# Patient Record
Sex: Male | Born: 1970 | Race: Black or African American | Hispanic: No | Marital: Single | Smoking: Current some day smoker
Health system: Southern US, Community
[De-identification: ages and names within clinical notes are randomized; demographics above are authoritative.]

## PROBLEM LIST (undated history)

## (undated) DIAGNOSIS — E119 Type 2 diabetes mellitus without complications: Secondary | ICD-10-CM

## (undated) DIAGNOSIS — Z789 Other specified health status: Secondary | ICD-10-CM

## (undated) HISTORY — PX: NO PAST SURGERIES: SHX2092

---

## 2006-07-05 ENCOUNTER — Emergency Department (HOSPITAL_COMMUNITY): Admission: EM | Admit: 2006-07-05 | Discharge: 2006-07-05 | Payer: Self-pay | Admitting: Emergency Medicine

## 2008-05-14 ENCOUNTER — Emergency Department (HOSPITAL_COMMUNITY): Admission: EM | Admit: 2008-05-14 | Discharge: 2008-05-14 | Payer: Self-pay | Admitting: Family Medicine

## 2008-10-29 ENCOUNTER — Emergency Department (HOSPITAL_COMMUNITY): Admission: EM | Admit: 2008-10-29 | Discharge: 2008-10-29 | Payer: Self-pay | Admitting: Emergency Medicine

## 2008-11-02 ENCOUNTER — Emergency Department (HOSPITAL_COMMUNITY): Admission: EM | Admit: 2008-11-02 | Discharge: 2008-11-02 | Payer: Self-pay | Admitting: Emergency Medicine

## 2012-01-02 ENCOUNTER — Encounter (HOSPITAL_COMMUNITY): Payer: Self-pay | Admitting: *Deleted

## 2012-01-02 ENCOUNTER — Emergency Department (HOSPITAL_COMMUNITY)
Admission: EM | Admit: 2012-01-02 | Discharge: 2012-01-02 | Disposition: A | Discharge status: Home / Self Care | Payer: Self-pay | Attending: Emergency Medicine | Admitting: Emergency Medicine

## 2012-01-02 DIAGNOSIS — L02419 Cutaneous abscess of limb, unspecified: Secondary | ICD-10-CM | POA: Insufficient documentation

## 2012-01-02 DIAGNOSIS — F17200 Nicotine dependence, unspecified, uncomplicated: Secondary | ICD-10-CM | POA: Insufficient documentation

## 2012-01-02 DIAGNOSIS — L02415 Cutaneous abscess of right lower limb: Secondary | ICD-10-CM

## 2012-01-02 MED ORDER — SULFAMETHOXAZOLE-TRIMETHOPRIM 800-160 MG PO TABS: 1.0000 | ORAL_TABLET | Freq: Two times a day (BID) | ORAL | Status: AC

## 2012-01-02 MED ORDER — LIDOCAINE-EPINEPHRINE 2 %-1:100000 IJ SOLN
20.0000 mL | Freq: Once | INTRAMUSCULAR | Status: AC
  Administered 2012-01-02: 20 mL

## 2012-01-02 MED ORDER — CEPHALEXIN 500 MG PO CAPS: 500.0000 mg | ORAL_CAPSULE | Freq: Four times a day (QID) | ORAL | Status: AC

## 2012-01-02 NOTE — ED Notes (Signed)
Pt has multiple small abscesses to bilateral lower extremities

## 2012-01-02 NOTE — ED Notes (Signed)
Reports he has been "picking " at the abscesses

## 2012-01-02 NOTE — ED Provider Notes (Signed)
History  This chart was scribed for Glynn Octave, MD by Shari Heritage. The patient was seen in room TR09C/TR09C. Patient's care was started at 0915.     CSN: [id number]  Arrival date & time 01/02/12  0915   First MD Initiated Contact with Patient 01/02/12 [phone]      Chief Complaint  Patient presents with  . Abscess    bilateral legs    The history is provided by the patient. No language interpreter was used.   Rodney Foley is a 41 y.o. male who presents to the Emergency Department complaining of multiple small boils to his bilateral upper extremities onset 4-5 days ago. There is associated stinging pain at the boil sites. Patient says that he has seen pus drain from at least one of these areas. Patient thinks he has had a mild fever over the past few days, but he hasn't taken his temperature. He states that he hasn't been camping recently. Patient denies any significant medical, surgical or family history. He is a current everyday smoker.   History  Substance Use Topics  . Smoking status: Current Everyday Smoker  . Smokeless tobacco: Not on file  . Alcohol Use: No     occ      Review of Systems A complete 10 system review of systems was obtained and all systems are negative except as noted in the HPI and PMH.   Allergies  Review of patient's allergies indicates no known allergies.  Home Medications  No current outpatient prescriptions on file.  BP 130/78  Pulse 72  Temp 98.5 F (36.9 C) (Oral)  Resp 20  SpO2 99%  Physical Exam  Constitutional: He is oriented to person, place, and time. He appears well-developed and well-nourished.  HENT:  Head: Normocephalic and atraumatic.  Musculoskeletal: Normal range of motion.  Neurological: He is alert and oriented to person, place, and time.  Skin: Skin is warm.       2 cm area of induration to the left anterior thigh. Scattered small areas of induration to right thigh and left medial thigh. 1 cm area of induration and  fluctuance to right anterior thigh.   Psychiatric: He has a normal mood and affect. His behavior is normal.    ED Course  Procedures (including critical care time) DIAGNOSTIC STUDIES: Oxygen Saturation is 99% on room air, normal by my interpretation.    COORDINATION OF CARE: 9:43am- Patient informed of current plan for treatment and evaluation and agrees with plan at this time.  10:15am- Performed two I & D procedures to abscesses on left anterior thigh and right anterior thigh.  INCISION AND DRAINAGE PROCEDURE NOTE: Patient identification was confirmed and verbal consent was obtained. This procedure was performed by Glynn Octave, MD at 10:15 AM. Site: Left anterior thigh Sterile procedures observed: yes Needle size: 25 Anesthetic used (type and amt): 1% lidocaine with epinephrine Blade size: 11 Drainage: copious purulent Complexity: Complex Packing used: 1/4 iodoform Site anesthetized, incision made over site, wound drained and explored loculations, rinsed with copious amounts of normal saline, wound packed with sterile gauze, covered with dry, sterile dressing.  Pt tolerated procedure well without complications.  Instructions for care discussed verbally and pt provided with additional written instructions for homecare and f/u.  INCISION AND DRAINAGE PROCEDURE NOTE: Patient identification was confirmed and verbal consent was obtained. This procedure was performed by Glynn Octave, MD at 10:15 AM. Site: right anterior thigh Sterile procedures observed: yes Needle size: 25 Anesthetic used (type and amt):  1% lidocaine with epinephrine Blade size: 11 Drainage: copious purulent Complexity: Complex Packing used: 1/4 iodoform Site anesthetized, incision made over site, wound drained and explored loculations, rinsed with copious amounts of normal saline, wound packed with sterile gauze, covered with dry, sterile dressing.  Pt tolerated procedure well without complications.   Instructions for care discussed verbally and pt provided with additional written instructions for homecare and f/u.    No diagnosis found.    MDM  Multiple abscess with probable underlying insect bites.  No systemic symptoms.  Drainage of two large abscesses as above.  Abx, warm soaks, wound check in 2 days.    I personally performed the services described in this documentation, which was scribed in my presence.  The recorded information has been reviewed and considered.    Glynn Octave, MD 01/02/12 [phone]

## 2012-01-05 ENCOUNTER — Emergency Department (HOSPITAL_COMMUNITY)
Admission: EM | Admit: 2012-01-05 | Discharge: 2012-01-05 | Disposition: A | Discharge status: Home / Self Care | Payer: Self-pay | Attending: Emergency Medicine | Admitting: Emergency Medicine

## 2012-01-05 ENCOUNTER — Encounter (HOSPITAL_COMMUNITY): Payer: Self-pay | Admitting: Emergency Medicine

## 2012-01-05 DIAGNOSIS — F17200 Nicotine dependence, unspecified, uncomplicated: Secondary | ICD-10-CM | POA: Insufficient documentation

## 2012-01-05 DIAGNOSIS — Z5189 Encounter for other specified aftercare: Secondary | ICD-10-CM | POA: Insufficient documentation

## 2012-01-05 NOTE — ED Notes (Signed)
Discharged home with written and verbal instructions.  No questions or concerns at discharge. 
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
## 2012-01-05 NOTE — ED Provider Notes (Signed)
History     CSN: [id number]  Arrival date & time 01/05/12  7829   First MD Initiated Contact with Patient 01/05/12 1030      Chief Complaint  Patient presents with  . Wound Check    (Consider location/radiation/quality/duration/timing/severity/associated sxs/prior treatment) HPI Comments: Patient presents for a wound check of bilateral upper thigh abscesses. The abscesses were drained 3 days ago and he was prescribed Keflex. He has taken his antibiotics and reports subjective improvement of the affected areas. He denies any fever, NVD, abdominal pain.   Patient is a 41 y.o. male presenting with wound check.  Wound Check     History reviewed. No pertinent past medical history.  History reviewed. No pertinent past surgical history.  History reviewed. No pertinent family history.  History  Substance Use Topics  . Smoking status: Current Everyday Smoker  . Smokeless tobacco: Not on file  . Alcohol Use: No     occ      Review of Systems  Constitutional: Negative for fever, diaphoresis and fatigue.  HENT: Negative for facial swelling, neck pain and neck stiffness.   Eyes: Negative for visual disturbance.  Respiratory: Negative for cough, shortness of breath and wheezing.   Cardiovascular: Negative for chest pain.  Gastrointestinal: Negative for nausea, vomiting, abdominal pain and diarrhea.  Musculoskeletal: Negative for back pain and arthralgias.  Skin: Positive for wound.  Neurological: Negative for weakness, numbness and headaches.    Allergies  Review of patient's allergies indicates no known allergies.  Home Medications   Current Outpatient Rx  Name Route Sig Dispense Refill  . CEPHALEXIN 500 MG PO CAPS Oral Take 1 capsule (500 mg total) by mouth 4 (four) times daily. 40 capsule 0  . SULFAMETHOXAZOLE-TRIMETHOPRIM 800-160 MG PO TABS Oral Take 1 tablet by mouth 2 (two) times daily. 28 tablet 0    BP 131/77  Pulse 63  Temp 98.2 F (36.8 C) (Oral)  Resp 18   SpO2 100%  Physical Exam  Nursing note and vitals reviewed. Constitutional: He is oriented to person, place, and time. He appears well-developed and well-nourished. No distress.  HENT:  Head: Normocephalic and atraumatic.  Eyes: Conjunctivae are normal. No scleral icterus.  Neck: Normal range of motion.  Cardiovascular: Normal rate and regular rhythm.  Exam reveals no gallop and no friction rub.   No murmur heard. Pulmonary/Chest: Effort normal and breath sounds normal. No respiratory distress. He has no wheezes. He has no rales. He exhibits no tenderness.  Abdominal: Soft. There is no tenderness.  Musculoskeletal: Normal range of motion.  Neurological: He is alert and oriented to person, place, and time.  Skin: Skin is warm and dry. He is not diaphoretic.       Bilateral upper thigh wounds reveal incision sites from previous I & D. The incisions are healing and reveal a small amount of purulence at the opening. No surrounding erythema or tenderness to palpation.   Psychiatric: He has a normal mood and affect. His behavior is normal.    ED Course  Procedures (including critical care time)  Labs Reviewed - No data to display No results found.   1. Wound check, abscess       MDM  Patient's wound shows improvement. I expressed a small amount of purulent drainage from each site and rebandaged the areas after putting Bacitracin on the wound. I instructed him to finish his antibiotics as directed and return to the ED with any worsening or concerning symptoms.  Emilia Beck, PA-C 01/05/12 1417

## 2012-01-05 NOTE — ED Notes (Signed)
Pt here for wound check from ID on leg 2 days; pt sts packing fell out this am

## 2012-01-07 NOTE — ED Provider Notes (Signed)
Medical screening examination/treatment/procedure(s) were performed by non-physician practitioner and as supervising physician I was immediately available for consultation/collaboration.   Gavin Pound. Oletta Lamas, MD 01/07/12 [phone]

## 2013-01-08 ENCOUNTER — Emergency Department (HOSPITAL_COMMUNITY): Payer: Self-pay

## 2013-01-08 ENCOUNTER — Encounter (HOSPITAL_COMMUNITY): Payer: Self-pay | Admitting: Radiology

## 2013-01-08 ENCOUNTER — Emergency Department (HOSPITAL_COMMUNITY)
Admission: EM | Admit: 2013-01-08 | Discharge: 2013-01-08 | Disposition: A | Discharge status: Home / Self Care | Payer: Self-pay | Attending: Emergency Medicine | Admitting: Emergency Medicine

## 2013-01-08 DIAGNOSIS — S41009A Unspecified open wound of unspecified shoulder, initial encounter: Secondary | ICD-10-CM | POA: Insufficient documentation

## 2013-01-08 DIAGNOSIS — Y929 Unspecified place or not applicable: Secondary | ICD-10-CM | POA: Insufficient documentation

## 2013-01-08 DIAGNOSIS — S0100XA Unspecified open wound of scalp, initial encounter: Secondary | ICD-10-CM | POA: Insufficient documentation

## 2013-01-08 DIAGNOSIS — S0990XA Unspecified injury of head, initial encounter: Secondary | ICD-10-CM | POA: Insufficient documentation

## 2013-01-08 DIAGNOSIS — IMO0002 Reserved for concepts with insufficient information to code with codable children: Secondary | ICD-10-CM | POA: Insufficient documentation

## 2013-01-08 DIAGNOSIS — S0101XA Laceration without foreign body of scalp, initial encounter: Secondary | ICD-10-CM

## 2013-01-08 DIAGNOSIS — T148XXA Other injury of unspecified body region, initial encounter: Secondary | ICD-10-CM

## 2013-01-08 DIAGNOSIS — F17200 Nicotine dependence, unspecified, uncomplicated: Secondary | ICD-10-CM | POA: Insufficient documentation

## 2013-01-08 DIAGNOSIS — Y939 Activity, unspecified: Secondary | ICD-10-CM | POA: Insufficient documentation

## 2013-01-08 DIAGNOSIS — S51809A Unspecified open wound of unspecified forearm, initial encounter: Secondary | ICD-10-CM | POA: Insufficient documentation

## 2013-01-08 MED ORDER — AMOXICILLIN-POT CLAVULANATE 875-125 MG PO TABS: 1.0000 | ORAL_TABLET | Freq: Two times a day (BID) | ORAL | Status: DC

## 2013-01-08 MED ORDER — AMOXICILLIN-POT CLAVULANATE 875-125 MG PO TABS
1.0000 | ORAL_TABLET | Freq: Once | ORAL | Status: AC
  Administered 2013-01-08: 1 via ORAL
  Filled 2013-01-08: qty 1
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
## 2013-01-18 ENCOUNTER — Emergency Department (INDEPENDENT_AMBULATORY_CARE_PROVIDER_SITE_OTHER)
Admission: EM | Admit: 2013-01-18 | Discharge: 2013-01-18 | Disposition: A | Discharge status: Home / Self Care | Payer: Self-pay | Source: Home / Self Care | Attending: Family Medicine | Admitting: Family Medicine

## 2013-01-18 ENCOUNTER — Encounter (HOSPITAL_COMMUNITY): Payer: Self-pay

## 2013-01-18 DIAGNOSIS — Z4802 Encounter for removal of sutures: Secondary | ICD-10-CM

## 2013-01-18 DIAGNOSIS — Z5189 Encounter for other specified aftercare: Secondary | ICD-10-CM

## 2013-01-18 NOTE — ED Notes (Signed)
Recheck of wounds from 8-26
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
## 2014-01-27 DIAGNOSIS — S01501A Unspecified open wound of lip, initial encounter: Secondary | ICD-10-CM | POA: Insufficient documentation

## 2014-01-27 DIAGNOSIS — F17200 Nicotine dependence, unspecified, uncomplicated: Secondary | ICD-10-CM | POA: Insufficient documentation

## 2014-01-27 DIAGNOSIS — Z792 Long term (current) use of antibiotics: Secondary | ICD-10-CM | POA: Insufficient documentation

## 2014-01-28 ENCOUNTER — Emergency Department (HOSPITAL_COMMUNITY)
Admission: EM | Admit: 2014-01-28 | Discharge: 2014-01-28 | Disposition: A | Discharge status: Home / Self Care | Payer: Self-pay | Attending: Emergency Medicine | Admitting: Emergency Medicine

## 2014-01-28 ENCOUNTER — Encounter (HOSPITAL_COMMUNITY): Payer: Self-pay | Admitting: Emergency Medicine

## 2014-01-28 DIAGNOSIS — S01511A Laceration without foreign body of lip, initial encounter: Secondary | ICD-10-CM

## 2014-01-28 MED ORDER — NAPROXEN 500 MG PO TABS
500.0000 mg | ORAL_TABLET | Freq: Two times a day (BID) | ORAL | Status: DC
Start: 2014-01-28 — End: 2015-02-23

## 2014-01-28 MED ORDER — PENICILLIN V POTASSIUM 500 MG PO TABS: 500.0000 mg | ORAL_TABLET | Freq: Four times a day (QID) | ORAL | Status: DC

## 2014-01-28 MED ORDER — TRAMADOL HCL 50 MG PO TABS: 50.0000 mg | ORAL_TABLET | Freq: Four times a day (QID) | ORAL | Status: DC | PRN

## 2014-01-28 MED ORDER — LIDOCAINE HCL (PF) 1 % IJ SOLN
5.0000 mL | Freq: Once | INTRAMUSCULAR | Status: AC
  Administered 2014-01-28: 5 mL via INTRADERMAL
  Filled 2014-01-28: qty 5

## 2014-01-28 NOTE — ED Notes (Signed)
Declined W/C at D/C and was escorted to lobby by RN. 
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
## 2014-01-28 NOTE — Discharge Instructions (Signed)
Sutures out in 5 days  Laceration:  The laceration is a cut or lesion that goes through all layers of the skin and into the tissue just beneath the skin. This may have been repaired by your caregiver with either stitches or a tissue adhesive similar to a super glue.  Please keep your wound clean and dry with a topical antibiotic and a sterile dressing for the next 48 hours. Your wound should be reevaluated by your family doctor within the next 2 days for a recheck. If you do not have a family doctor you may return to the emergency department for a recheck or see the list of followup doctors below.  Seek medical attention if:   There is redness, swelling, increasing pain in the wound  There is a red line that goes up your arm or leg  Pus is coming from the wound  He developed an unexplained temperature above 100.65F  He noticed a foul-smelling coming from the wound or dressing  There is a breaking open of the wound after the sutures have been removed  If you did not receive a tetanus shot today because she thought she were up to date but did not recall when her last one was given, nature to check with her primary caregiver to determine if she needs one.   RESOURCE GUIDE  Dental Problems  Patients with Medicaid: [facility] [phone] [address]                                           [phone] [address] Phone:  [phone]                                                  Phone:  [phone]  If unable to pay or uninsured, contact:  [facility] or [facility]. to become qualified for the adult dental clinic.  Chronic Pain Problems Contact [facility]  [phone] Patients need to be referred by their primary care doctor.  Insufficient Money for Medicine Contact [employer]:  call "211" or [facility] 2405330674.  No Primary Care Doctor Call [facility]  [phone] Other agencies that  provide inexpensive medical care    [facility]  [phone]    [facility]  [phone]    [facility]  [phone]    [facility]  [phone]    [facility]  [phone]    [facility]  [phone]  Psychological Services [facility]  [phone] [facility]  [phone] [facility]   [phone] (emergency services (319)[phone])  Substance Abuse Resources [facility]  ([phone] [facility] 316-362-6609 [employer] [phone] Floydene Flock 9734204260 Residential & Outpatient Substance Abuse Program  (214) 193-7084  Abuse/Neglect [facility] Child Abuse Hotline 9208164969 [facility] Child Abuse Hotline 972-150-5532 (After Hours)  Emergency Shelter [facility] 8385019641  Maternity Homes Room at the [place] [facility] 515-552-0910 [facility] Services [phone]  MRSA Hotline #:   (218)875-4268    Aaron Edelman  [employer]  [facility]     [employer]                          [facility] [address] [place]                       [address]      [address]  Blondell Reveal Phone:  [phone]                                   Phone:  [phone]                 Phone:  [phone]  [facility] Phone:  [phone]  [facility] Child Abuse Hotline (713)[phone] (After Hours)
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
## 2014-02-04 ENCOUNTER — Encounter (HOSPITAL_COMMUNITY): Payer: Self-pay | Admitting: Emergency Medicine

## 2014-02-04 ENCOUNTER — Emergency Department (HOSPITAL_COMMUNITY)
Admission: EM | Admit: 2014-02-04 | Discharge: 2014-02-04 | Disposition: A | Discharge status: Home / Self Care | Payer: Self-pay | Attending: Emergency Medicine | Admitting: Emergency Medicine

## 2014-02-04 DIAGNOSIS — Z4802 Encounter for removal of sutures: Secondary | ICD-10-CM | POA: Insufficient documentation

## 2014-02-04 DIAGNOSIS — Z79899 Other long term (current) drug therapy: Secondary | ICD-10-CM | POA: Insufficient documentation

## 2014-02-04 DIAGNOSIS — Z792 Long term (current) use of antibiotics: Secondary | ICD-10-CM | POA: Insufficient documentation

## 2014-02-04 DIAGNOSIS — F17200 Nicotine dependence, unspecified, uncomplicated: Secondary | ICD-10-CM | POA: Insufficient documentation

## 2014-02-04 DIAGNOSIS — Z791 Long term (current) use of non-steroidal anti-inflammatories (NSAID): Secondary | ICD-10-CM | POA: Insufficient documentation

## 2014-02-04 NOTE — ED Provider Notes (Signed)
CSN: [id number]     Arrival date & time 02/04/14  1825 History  This chart was scribed for non-physician practitioner, Marlon Pel, PA-C working with Geoffery Lyons, MD by Luisa Dago, ED scribe. This patient was seen in room TR10C/TR10C and the patient's care was started at 7:16 PM.   Chief Complaint  Patient presents with  . Suture / Staple Removal    The history is provided by the patient. No language interpreter was used.   HPI Comments: Rodney Foley is a 43 y.o. male who presents to the Emergency Department requesting a suture removal. Was seen in the ED on 9/15 with a laceration to the lower lip, 5 sutures were placed. He states that the laceration is healing well. Denies any redness, increased pain, swelling, fever, chills, nausea, emesis, abdominal pain, headaches, SOB, or chest pain.   No past medical history on file. No past surgical history on file. No family history on file. History  Substance Use Topics  . Smoking status: Current Every Day Smoker  . Smokeless tobacco: Not on file  . Alcohol Use: No     Comment: occ    Review of Systems  Constitutional: Negative for fever and chills.  HENT: Negative for congestion.   Eyes: Negative for visual disturbance.  Respiratory: Negative for cough.   Gastrointestinal: Negative for nausea and vomiting.  Genitourinary: Negative for dysuria.  Skin: Positive for wound.   Allergies  Review of patient's allergies indicates no known allergies.  Home Medications   Prior to Admission medications   Medication Sig Start Date End Date Taking? Authorizing Provider  amoxicillin-clavulanate (AUGMENTIN) 875-125 MG per tablet Take 1 tablet by mouth 2 (two) times daily. One po bid x 10 days 01/08/13   April K Palumbo-Rasch, MD  naproxen (NAPROSYN) 500 MG tablet Take 1 tablet (500 mg total) by mouth 2 (two) times daily with a meal. 01/28/14   Vida Roller, MD  penicillin v potassium (VEETID) 500 MG tablet Take 1 tablet (500 mg total) by  mouth 4 (four) times daily. 01/28/14   Vida Roller, MD  traMADol (ULTRAM) 50 MG tablet Take 1 tablet (50 mg total) by mouth every 6 (six) hours as needed. 01/28/14   Vida Roller, MD   Triage vitals:BP 127/73  Pulse 68  Temp(Src) 97 F (36.1 C) (Oral)  Resp 12  SpO2 97%  Physical Exam  Nursing note and vitals reviewed. Constitutional: He is oriented to person, place, and time. He appears well-developed and well-nourished. No distress.  HENT:  Head: Normocephalic and atraumatic.  Right Ear: External ear normal.  Left Ear: External ear normal.  Nose: Nose normal.  Mouth/Throat: Oropharynx is clear and moist.  5 intact sutures to lower lip with no associated cellulitis. Wound well appearing.   Eyes: Conjunctivae and EOM are normal. Pupils are equal, round, and reactive to light.  Neck: Normal range of motion. Neck supple.  Cardiovascular: Normal rate.   Pulmonary/Chest: Effort normal. No respiratory distress.  Musculoskeletal: Normal range of motion.  Neurological: He is alert and oriented to person, place, and time.  Skin: Skin is warm and dry.  Psychiatric: He has a normal mood and affect. His behavior is normal.    ED Course  Procedures (including critical care time)  DIAGNOSTIC STUDIES: Oxygen Saturation is 97% on RA, normal by my interpretation.    COORDINATION OF CARE: 7:19 PM- Patient presents for suture removal. The wound is well healed without signs of infection.  The sutures are  removed. Wound care and activity instructions given. Return prn. Pt advised of plan for treatment and pt agrees.  SUTURE REMOVAL Performed by: Marlon Pel, PA-C Consent: Verbal consent obtained. Patient identity confirmed: provided demographic data Location: lower lip Wound Appearance: clean Sutures/Staples Removed: 5 Patient tolerance: Patient tolerated the procedure well with no immediate complications.   Labs Review Labs Reviewed - No data to display  Imaging Review No  results found.   EKG Interpretation None      MDM   Final diagnoses:  Visit for suture removal    43 y.o.[name]'s evaluation in the Emergency Department is complete. It has been determined that no acute conditions requiring further emergency intervention are present at this time. The patient/guardian have been advised of the diagnosis and plan. We have discussed signs and symptoms that warrant return to the ED, such as changes or worsening in symptoms.  Vital signs are stable at discharge. Filed Vitals:   02/04/14 1846  BP: 127/73  Pulse: 68  Temp: 97 F (36.1 C)  Resp: 12    Patient/guardian has voiced understanding and agreed to follow-up with the PCP or specialist.   I personally performed the services described in this documentation, which was scribed in my presence. The recorded information has been reviewed and is accurate.    Dorthula Matas, PA-C 02/04/14 1946

## 2014-02-04 NOTE — ED Notes (Signed)
Suture removal from lower lip. No drainage, redness, warmth to site.

## 2014-02-04 NOTE — Discharge Instructions (Signed)

## 2014-02-06 NOTE — ED Provider Notes (Signed)
Medical screening examination/treatment/procedure(s) were performed by non-physician practitioner and as supervising physician I was immediately available for consultation/collaboration.     Geoffery Lyons, MD 02/06/14 [phone]

## 2015-02-23 ENCOUNTER — Emergency Department (HOSPITAL_COMMUNITY)
Admission: EM | Admit: 2015-02-23 | Discharge: 2015-02-23 | Disposition: A | Discharge status: Home / Self Care | Payer: Self-pay | Attending: Emergency Medicine | Admitting: Emergency Medicine

## 2015-02-23 ENCOUNTER — Encounter (HOSPITAL_COMMUNITY): Payer: Self-pay | Admitting: *Deleted

## 2015-02-23 ENCOUNTER — Emergency Department (HOSPITAL_COMMUNITY): Payer: Self-pay

## 2015-02-23 DIAGNOSIS — Z792 Long term (current) use of antibiotics: Secondary | ICD-10-CM | POA: Insufficient documentation

## 2015-02-23 DIAGNOSIS — Z720 Tobacco use: Secondary | ICD-10-CM | POA: Insufficient documentation

## 2015-02-23 DIAGNOSIS — M25561 Pain in right knee: Secondary | ICD-10-CM | POA: Insufficient documentation

## 2015-02-23 MED ORDER — TRAMADOL HCL 50 MG PO TABS: 50.0000 mg | ORAL_TABLET | Freq: Four times a day (QID) | ORAL | Status: DC | PRN

## 2015-02-23 MED ORDER — NAPROXEN 500 MG PO TABS: 500.0000 mg | ORAL_TABLET | Freq: Two times a day (BID) | ORAL | Status: DC

## 2015-02-23 NOTE — Discharge Instructions (Signed)
Be sure to read and understand instructions below prior to leaving the hospital. It is recommended that you follow up with the Orthopedist listed above. Use your pain medication as prescribed and do not operate heavy machinery while on pain medication.   Knee Effusion  The medical term for having fluid in your knee is effusion.This means something is wrong inside the knee. Some of the causes of fluid in the knee may be torn cartilage, a torn ligament, or bleeding into the joint from an injury. Small tears may heal on their own with conservative treatment. Conservative means rest, limited weight bearing activity and muscle strengthening exercises. Your recovery may take up to 6 weeks. Larger tears may require surgery.   TREATMENT  Rest, ice, elevation, and compression are the basic modes of treatment.   Apply ice to the sore area for 15 to 20 minutes, 3 to 4 times per day. Do this while you are awake for the first 2 days, or as directed. This can be stopped when the swelling goes away. Put the ice in a plastic bag and place a towel between the bag of ice and your skin.  Keep your leg elevated when possible to lessen swelling.  If your caregiver recommends crutches, use them as instructed for 1 week. Then, you may walk as tolerated.  Do not drive a vehicle on pain medication. ACTIVITY:            - Weight bearing as tolerated            - Exercises should be limited to pain free range of motion  Knee Immobilization:: This is used to support and protect an injured or painful knee. Knee immobilizers keep your knee from being used while it is healing.  Use powder to control irritation from sweat and friction.  Adjust the immobilizer to be firm but not tight. Signs of an immobilizer that is too tight include:   Swelling.   Numbness.   Color change in your foot or ankle.   Increased pain.  While resting, raise your leg above the level of your heart. This reduces throbbing and helps healing. Prop it up  with pillows.  Remove the immobilizer to bathe and sleep. Wear it other times until you see your doctor again.               SEEK MEDICAL CARE IF:  You have an increase in bruising, swelling, or pain.  Your toes feel cold.  Pain relief is not achieved with medications.  EMERGENCY:: Your toes are numb or blue or you have severe pain.  You notice redness, swelling, warmth or increasing pain in your knee.  An unexplained oral temperature above 102 F (38.9 C) develops.  COLD THERAPY DIRECTIONS:  Ice or gel packs can be used to reduce both pain and swelling. Ice is the most helpful within the first 24 to 48 hours after an injury or flareup from overusing a muscle or joint.  Ice is effective, has very few side effects, and is safe for most people to use.   If you expose your skin to cold temperatures for too long or without the proper protection, you can damage your skin or nerves. Watch for signs of skin damage due to cold.   HOME CARE INSTRUCTIONS  Follow these tips to use ice and cold packs safely.  Place a dry or damp towel between the ice and skin. A damp towel will cool the skin more quickly, so you  may need to shorten the time that the ice is used.  For a more rapid response, add gentle compression to the ice.  Ice for no more than 10 to 20 minutes at a time. The bonier the area you are icing, the less time it will take to get the benefits of ice.  Check your skin after 5 minutes to make sure there are no signs of a poor response to cold or skin damage.  Rest 20 minutes or more in between uses.  Once your skin is numb, you can end your treatment. You can test numbness by very lightly touching your skin. The touch should be so light that you do not see the skin dimple from the pressure of your fingertip. When using ice, most people will feel these normal sensations in this order: cold, burning, aching, and numbness.  Do not use ice on someone who cannot communicate their responses to pain,  such as small children or people with dementia.   HOW TO MAKE AN ICE PACK  To make an ice pack, do one of the following:  Place crushed ice or a bag of frozen vegetables in a sealable plastic bag. Squeeze out the excess air. Place this bag inside another plastic bag. Slide the bag into a pillowcase or place a damp towel between your skin and the bag.  Mix 3 parts water with 1 part rubbing alcohol. Freeze the mixture in a sealable plastic bag. When you remove the mixture from the freezer, it will be slushy. Squeeze out the excess air. Place this bag inside another plastic bag. Slide the bag into a pillowcase or place a damp towel between your s

## 2015-02-23 NOTE — ED Notes (Signed)
Pt reports pain to RT knee . Pt does not remember any injury.

## 2015-02-23 NOTE — ED Notes (Signed)
Declined W/C at D/C and was escorted to lobby by RN. 

## 2015-02-23 NOTE — ED Provider Notes (Signed)
CSN: [id number]     Arrival date & time 02/23/15  4098 History  By signing my name below, I, Essence Howell, attest that this documentation has been prepared under the direction and in the presence of [name], PA-C Electronically Signed: Charline Bills, ED Scribe 02/23/2015 at 12:17 PM.   No chief complaint on file.  The history is provided by the patient. No language interpreter was used.   HPI Comments: Rodney Foley is a 44 y.o. male who presents to the Emergency Department complaining of sudden onset of intermittent right knee pain for the past few weeks, worsened over the past 3 days. Pt reports that he has been ambulating with a limp due to severity of pain. Pt denies injury. He also denies fever, chills, swelling, redness, numbness/tingling in right foot. Pt has tried soaking in Epson salt and alcohol baths, Icy Hot and Ace bandages without significant relief.   No past medical history on file. No past surgical history on file. No family history on file. Social History  Substance Use Topics  . Smoking status: Current Every Day Smoker  . Smokeless tobacco: Not on file  . Alcohol Use: No     Comment: occ    Review of Systems  Constitutional: Negative for fever and chills.  Musculoskeletal: Positive for arthralgias. Negative for joint swelling.  Skin: Negative for color change.  Neurological: Negative for numbness.  All other systems reviewed and are negative.  Allergies  Review of patient's allergies indicates no known allergies.  Home Medications   Prior to Admission medications   Medication Sig Start Date End Date Taking? Authorizing Provider  amoxicillin-clavulanate (AUGMENTIN) 875-125 MG per tablet Take 1 tablet by mouth 2 (two) times daily. One po bid x 10 days 01/08/13   [name], MD  naproxen (NAPROSYN) 500 MG tablet Take 1 tablet (500 mg total) by mouth 2 (two) times daily with a meal. 01/28/14   Eber Hong, MD  penicillin v potassium (VEETID) 500 MG  tablet Take 1 tablet (500 mg total) by mouth 4 (four) times daily. 01/28/14   Eber Hong, MD  traMADol (ULTRAM) 50 MG tablet Take 1 tablet (50 mg total) by mouth every 6 (six) hours as needed. 01/28/14   Eber Hong, MD   BP 129/84 mmHg  Pulse 80  Temp(Src) 98.1 F (36.7 C) (Oral)  Resp 14  SpO2 97% Physical Exam  Constitutional: He is oriented to person, place, and time. He appears well-developed and well-nourished. No distress.  HENT:  Head: Normocephalic and atraumatic.  Eyes: Conjunctivae and EOM are normal.  Neck: Neck supple. No tracheal deviation present.  Cardiovascular: Normal rate, regular rhythm and normal heart sounds.   Pulses:      Dorsalis pedis pulses are 2+ on the right side.  Pulmonary/Chest: Effort normal and breath sounds normal. No respiratory distress.  Musculoskeletal: Normal range of motion.  R knee: Tenderness to palpation along the lateral joint line of R knee.  Mild diffuse edema. No warmth or erythema of the R knee. Full ROM of the right knee  Neurological: He is alert and oriented to person, place, and time. No sensory deficit.  Sensation of right foot intact.   Skin: Skin is warm and dry.  Psychiatric: He has a normal mood and affect. His behavior is normal.  Nursing note and vitals reviewed.  ED Course  Procedures (including critical care time) DIAGNOSTIC STUDIES: Oxygen Saturation is 97% on RA, normal by my interpretation.    COORDINATION OF CARE: 9:16  AM-Discussed treatment plan which includes XR with pt at bedside and pt agreed to plan.   Labs Review Labs Reviewed - No data to display  Imaging Review Dg Knee Complete 4 Views Right  02/23/2015   CLINICAL DATA:  Pain along the right anterior knee for 4 days. Patient unable to assume standing position.  EXAM: RIGHT KNEE - COMPLETE 4+ VIEW  COMPARISON:  None.  FINDINGS: Bipartite patella. There appears to be some sclerosis in the patella and spurring along the bipartite portion. There is some  soft tissue prominence along the suprapatellar region with poor definition of fat planes along the quadriceps tendon, but without a well-defined suprapatellar effusion. Questionable stranding in Hoffa's fat pad. Mild prepatellar soft tissue swelling.  IMPRESSION: 1. 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 The sclerosis is a somewhat unusual feature. There is also surrounding soft tissue prominence including subcutaneous edema and low-level edema infiltrating Hoffa's fat pad. If the patient had trauma in this vicinity, and might have disrupted the synchondrosis along the bipartite patella with reactive bony changes and surrounding soft tissue inflammation. If there are signs/ symptoms of infection then the possibility of cellulitis is raised. If further imaging workup is warranted, MRI of the right knee might be considered.   Electronically Signed   By: Gaylyn Rong M.D.   On: 02/23/2015 11:52   I have personally reviewed and evaluated these images and lab results as part of my medical decision-making.   EKG Interpretation None      MDM   Final diagnoses:  None   Patient presents with right knee pain that has been present intermittently over a few weeks, but worse over the past few days.  No acute injury or trauma.  No signs of infection on exam.  Patient afebrile.  Full ROM of the right knee.  Xray results as noted above.  Since there is no evidence of infection or injury.  Therefore, do not feel that the MRI is indicated at this time.  Patient informed of the xray results and given follow up with Orthopedics.  Stable for discharge.  Return precautions given.    [name], PA-C 02/23/15 1605  Pricilla Loveless, MD 02/24/15 [phone]

## 2015-02-23 NOTE — ED Notes (Signed)
PT offered something to eat and drink. Pt declined at this time.

## 2015-03-16 ENCOUNTER — Emergency Department (HOSPITAL_COMMUNITY)
Admission: EM | Admit: 2015-03-16 | Discharge: 2015-03-16 | Disposition: A | Discharge status: Home / Self Care | Payer: Self-pay | Attending: Emergency Medicine | Admitting: Emergency Medicine

## 2015-03-16 ENCOUNTER — Encounter (HOSPITAL_COMMUNITY): Payer: Self-pay | Admitting: Emergency Medicine

## 2015-03-16 DIAGNOSIS — Z720 Tobacco use: Secondary | ICD-10-CM | POA: Insufficient documentation

## 2015-03-16 DIAGNOSIS — Z792 Long term (current) use of antibiotics: Secondary | ICD-10-CM | POA: Insufficient documentation

## 2015-03-16 DIAGNOSIS — H6001 Abscess of right external ear: Secondary | ICD-10-CM | POA: Insufficient documentation

## 2015-03-16 DIAGNOSIS — Z791 Long term (current) use of non-steroidal anti-inflammatories (NSAID): Secondary | ICD-10-CM | POA: Insufficient documentation

## 2015-03-16 MED ORDER — ACETAMINOPHEN 325 MG PO TABS
650.0000 mg | ORAL_TABLET | Freq: Once | ORAL | Status: AC
  Administered 2015-03-16: 650 mg via ORAL
  Filled 2015-03-16: qty 2

## 2015-03-16 NOTE — ED Notes (Signed)
Pt. reports worsening abscess at right ear onset last week with drainage , denies fever .

## 2015-03-16 NOTE — ED Provider Notes (Signed)
CSN: [id number]     Arrival date & time 03/16/15  2137 History  By signing my name below, I, Rodney Foley, attest that this documentation has been prepared under the direction and in the presence of [name], PA-C. Electronically Signed: Angelene GiovanniEmmanuella Foley, ED Scribe. 03/16/2015. 10:01 PM.    Chief Complaint  Patient presents with  . Abscess   The history is provided by the patient. No language interpreter was used.   HPI Comments: Rodney Foley is a 44 y.o. male who presents to the Emergency Department complaining of painful abscess on his right pre-auricular face onset last week. He reports associated drainage of yellow pus that began today. He explains that his symptoms started as a pimple and he scratched it. After scratching it, it turned into a scab and eventually into the abscess. He has not tried anything for pain relief. He denies ear pain, trouble hearing, fever, sore throat, neck pain, redness of the skin around the abscess, warmth of the skin, nausea, vomiting or myalgias.   History reviewed. No pertinent past medical history. History reviewed. No pertinent past surgical history. No family history on file. Social History  Substance Use Topics  . Smoking status: Current Every Day Smoker  . Smokeless tobacco: None  . Alcohol Use: No    Review of Systems  Constitutional: Negative for fever, chills and diaphoresis.  HENT: Negative for ear discharge, ear pain, facial swelling, hearing loss, sore throat and trouble swallowing.   Respiratory: Negative for shortness of breath.   Gastrointestinal: Negative for nausea and vomiting.  Musculoskeletal: Negative for myalgias, neck pain and neck stiffness.  Skin: Positive for wound. Negative for color change.       Abscess on right ear  Neurological: Negative for dizziness and headaches.      Allergies  Review of patient's allergies indicates no known allergies.  Home Medications   Prior to Admission medications    Medication Sig Start Date End Date Taking? Authorizing Provider  amoxicillin-clavulanate (AUGMENTIN) 875-125 MG per tablet Take 1 tablet by mouth 2 (two) times daily. One po bid x 10 days 01/08/13   [name], MD  naproxen (NAPROSYN) 500 MG tablet Take 1 tablet (500 mg total) by mouth 2 (two) times daily. 02/23/15   [name], PA-C  penicillin v potassium (VEETID) 500 MG tablet Take 1 tablet (500 mg total) by mouth 4 (four) times daily. 01/28/14   [name], MD  traMADol (ULTRAM) 50 MG tablet Take 1 tablet (50 mg total) by mouth every 6 (six) hours as needed. 02/23/15   Santiago GladHeather Laisure, PA-C   There were no vitals taken for this visit. Physical Exam  Constitutional: He appears well-developed and well-nourished. No distress.  HENT:  Head: Normocephalic and atraumatic.  Right Ear: Tympanic membrane, external ear and ear canal normal.  Left Ear: Tympanic membrane, external ear and ear canal normal.  Ears:  Mouth/Throat: Oropharynx is clear and moist. No oropharyngeal exudate.  Small, 0.5 cm abscess over right tragus. Small area of fluctuance that is currently draining white-yellow exudate. No induration. Small amount of overlying erythema. No erythema extending from abscess.   Eyes: Conjunctivae are normal. Right eye exhibits no discharge. Left eye exhibits no discharge. No scleral icterus.  Neck: Normal range of motion. Neck supple.  No cervical adenopathy. No swelling of the soft tissue. Neck is supple without tenderness. FROM intact and without pain.   Cardiovascular: Normal rate.   Pulmonary/Chest: Effort normal.  Musculoskeletal: Normal range of motion.  Moves  all extremities spontaneously  Lymphadenopathy:    He has no cervical adenopathy.  Neurological: He is alert. Coordination normal.  Skin: Skin is warm and dry.  Psychiatric: He has a normal mood and affect. His behavior is normal.  Nursing note and vitals reviewed.   ED Course  Procedures (including critical care  time) DIAGNOSTIC STUDIES: Oxygen Saturation is 100% on RA, normal by my interpretation.    COORDINATION OF CARE: 10:00 PM- Pt advised of plan for treatment and pt agrees. Pt will receive pain medication. Assured that his abscess is not spreading to infect other areas. Recommended to hold a hot towel to the area.    EKG Interpretation None      MDM   Final diagnoses:  Abscess of tragus, right   Patient with skin abscess already actively draining. No indication for I&D at this time. Encouraged home warm soaks and OTC pain relievers. No antibiotic therapy is indicated. Return precautions given in discharge paperwork and discussed with pt at bedside. Pt stable for discharge    I personally performed the services described in this documentation, which was scribed in my presence. The recorded information has been reviewed and is accurate.   [name], PA-C 03/17/15 1115  [name], MD 03/19/15 [phone]

## 2015-03-16 NOTE — Discharge Instructions (Signed)
-   Use warm compresses on the abscess to encourage draining of the abscess - Keep area clean and dry. Do not pick at the wound - Return to ED with fevers, chills, increased pain, redness of the skin around the abscess, muscle aches, nausea, vomiting or any new or concerning symptoms   Abscess An abscess is an infected area that contains a collection of pus and debris.It can occur in almost any part of the body. An abscess is also known as a furuncle or boil. CAUSES  An abscess occurs when tissue gets infected. This can occur from blockage of oil or sweat glands, infection of hair follicles, or a minor injury to the skin. As the body tries to fight the infection, pus collects in the area and creates pressure under the skin. This pressure causes pain. People with weakened immune systems have difficulty fighting infections and get certain abscesses more often.  SYMPTOMS Usually an abscess develops on the skin and becomes a painful mass that is red, warm, and tender. If the abscess forms under the skin, you may feel a moveable soft area under the skin. Some abscesses break open (rupture) on their own, but most will continue to get worse without care. The infection can spread deeper into the body and eventually into the bloodstream, causing you to feel ill.  DIAGNOSIS  Your caregiver will take your medical history and perform a physical exam. A sample of fluid may also be taken from the abscess to determine what is causing your infection. TREATMENT  Your caregiver may prescribe antibiotic medicines to fight the infection. However, taking antibiotics alone usually does not cure an abscess. Your caregiver may need to make a small cut (incision) in the abscess to drain the pus. In some cases, gauze is packed into the abscess to reduce pain and to continue draining the area. HOME CARE INSTRUCTIONS   Only take over-the-counter or prescription medicines for pain, discomfort, or fever as directed by your  caregiver.  If you were prescribed antibiotics, take them as directed. Finish them even if you start to feel better.  If gauze is used, follow your caregiver's directions for changing the gauze.  To avoid spreading the infection:  Keep your draining abscess covered with a bandage.  Wash your hands well.  Do not share personal care items, towels, or whirlpools with others.  Avoid skin contact with others.  Keep your skin and clothes clean around the abscess.  Keep all follow-up appointments as directed by your caregiver. SEEK MEDICAL CARE IF:   You have increased pain, swelling, redness, fluid drainage, or bleeding.  You have muscle aches, chills, or a general ill feeling.  You have a fever. MAKE SURE YOU:   Understand these instructions.  Will watch your condition.  Will get help right away if you are not doing well or get worse.   This information is not intended to replace advice given to you by your health care provider. Make sure you discuss any questions you have with your health care provider.   Document Released: 02/09/2005 Document Revised: 11/01/2011 Document Reviewed: 07/15/2011 Elsevier Interactive Patient Education [employer]2016 Elsevier Inc.

## 2015-07-25 ENCOUNTER — Encounter (HOSPITAL_COMMUNITY): Payer: Self-pay | Admitting: *Deleted

## 2015-07-25 ENCOUNTER — Emergency Department (HOSPITAL_COMMUNITY)
Admission: EM | Admit: 2015-07-25 | Discharge: 2015-07-25 | Disposition: A | Discharge status: Home / Self Care | Payer: BLUE CROSS/BLUE SHIELD | Attending: Emergency Medicine | Admitting: Emergency Medicine

## 2015-07-25 DIAGNOSIS — Z791 Long term (current) use of non-steroidal anti-inflammatories (NSAID): Secondary | ICD-10-CM | POA: Diagnosis not present

## 2015-07-25 DIAGNOSIS — K0889 Other specified disorders of teeth and supporting structures: Secondary | ICD-10-CM | POA: Diagnosis present

## 2015-07-25 DIAGNOSIS — K0381 Cracked tooth: Secondary | ICD-10-CM | POA: Insufficient documentation

## 2015-07-25 DIAGNOSIS — Z792 Long term (current) use of antibiotics: Secondary | ICD-10-CM | POA: Insufficient documentation

## 2015-07-25 DIAGNOSIS — F17200 Nicotine dependence, unspecified, uncomplicated: Secondary | ICD-10-CM | POA: Diagnosis not present

## 2015-07-25 MED ORDER — NAPROXEN 500 MG PO TABS: 500.0000 mg | ORAL_TABLET | Freq: Two times a day (BID) | ORAL | Status: DC

## 2015-07-25 MED ORDER — HYDROCODONE-ACETAMINOPHEN 5-325 MG PO TABS
1.0000 | ORAL_TABLET | Freq: Once | ORAL | Status: AC
  Administered 2015-07-25: 1 via ORAL
  Filled 2015-07-25: qty 1

## 2015-07-25 MED ORDER — PENICILLIN V POTASSIUM 500 MG PO TABS: 500.0000 mg | ORAL_TABLET | Freq: Four times a day (QID) | ORAL | Status: AC

## 2015-07-25 NOTE — ED Notes (Signed)
Pt reports dental problem for a couple months . Pain worse this AM.

## 2015-07-25 NOTE — ED Provider Notes (Signed)
CSN: [id number]     Arrival date & time 07/25/15  1612 History  By signing my name below, I, [name], attest that this documentation has been prepared under the direction and in the presence of Sweta Halseth PA-C. Electronically Signed: [name], ED Scribe. 07/25/2015 5:25 PM   Chief Complaint  Patient presents with  . Dental Problem    Patient is a 45 y.o. male presenting with tooth pain. The history is provided by the patient. No language interpreter was used.  Dental Pain Location:  Upper Upper teeth location:  4/RU 2nd bicuspid Quality:  Aching Severity:  Severe Onset quality:  Gradual Timing:  Intermittent Progression:  Worsening Chronicity:  Recurrent Context: dental fracture and poor dentition   Context: not trauma   Relieved by:  Nothing Ineffective treatments:  NSAIDs and topical anesthetic gel Associated symptoms: facial swelling   Associated symptoms: no difficulty swallowing, no drooling, no facial pain, no fever, no neck pain, no neck swelling and no trismus   Risk factors: lack of dental care    Rodney Foley is a 45 y.o. male who presents to the Emergency Department complaining of constant, 10/10 in severity, worsening right upper dental pain. Pt states that he has been needing to have his tooth pulled for months but his symptoms worsened last night. He reports associated right cheek swelling that he noted last evening. Denies overlying erythema. Ibuprofen, Goody Powder, and Orajel have provided insufficient relief in pain at home. Pt denies fever, difficulty swallowing, difficulty breathing, nausea, vomiting, and facial redness. He does not have a dentist.   History reviewed. No pertinent past medical history. History reviewed. No pertinent past surgical history. History reviewed. No pertinent family history. Social History  Substance Use Topics  . Smoking status: Current Every Day Smoker  . Smokeless tobacco: None  . Alcohol Use: No    Review of  Systems  Constitutional: Negative for fever.  HENT: Positive for dental problem and facial swelling. Negative for drooling and trouble swallowing.   Gastrointestinal: Negative for nausea and vomiting.  Musculoskeletal: Negative for neck pain.  Skin: Negative for color change.  All other systems reviewed and are negative.  Allergies  Review of patient's allergies indicates no known allergies.  Home Medications   Prior to Admission medications   Medication Sig Start Date End Date Taking? Authorizing Provider  amoxicillin-clavulanate (AUGMENTIN) 875-125 MG per tablet Take 1 tablet by mouth 2 (two) times daily. One po bid x 10 days 01/08/13   [name], MD  naproxen (NAPROSYN) 500 MG tablet Take 1 tablet (500 mg total) by mouth 2 (two) times daily. 02/23/15   [name], PA-C  naproxen (NAPROSYN) 500 MG tablet Take 1 tablet (500 mg total) by mouth 2 (two) times daily. 07/25/15   Krishang Reading, PA-C  penicillin v potassium (VEETID) 500 MG tablet Take 1 tablet (500 mg total) by mouth 4 (four) times daily. 01/28/14   [name], MD  penicillin v potassium (VEETID) 500 MG tablet Take 1 tablet (500 mg total) by mouth 4 (four) times daily. 07/25/15 08/01/15  Aretta Stetzel, PA-C  traMADol (ULTRAM) 50 MG tablet Take 1 tablet (50 mg total) by mouth every 6 (six) hours as needed. 02/23/15   [name], PA-C   BP 114/84 mmHg  Pulse 98  Temp(Src) 98.4 F (36.9 C) (Oral)  Resp 16  Ht 5\' 10"  (1.778 m)  Wt 211 lb 5 oz (95.851 kg)  BMI 30.32 kg/m2  SpO2 98% Physical Exam  Constitutional:  He appears well-developed and well-nourished. No distress.  HENT:  Head: Normocephalic and atraumatic.  Right Ear: External ear normal.  Left Ear: External ear normal.  Mouth/Throat: Uvula is midline and oropharynx is clear and moist. Mucous membranes are not dry. No trismus in the jaw. No dental abscesses or uvula swelling.    Dental fracture of right upper second bicuspid. No erythema or drainage  from the surrounding gumline. No obvious dental abscess. No trismus. No erythema or soft tissue swelling of the cheek.   Eyes: Conjunctivae are normal. Right eye exhibits no discharge. Left eye exhibits no discharge. No scleral icterus.  Neck: Normal range of motion. Neck supple.  FROM of neck intact without pain. No TTP. No cervical adenopathy. No soft tissue swelling of the neck.   Cardiovascular: Normal rate.   Pulmonary/Chest: Effort normal. No stridor. No respiratory distress.  Musculoskeletal: Normal range of motion.  Moves all extremities spontaneously  Lymphadenopathy:    He has no cervical adenopathy.  Neurological: He is alert. Coordination normal.  Skin: Skin is warm and dry.  Psychiatric: He has a normal mood and affect. His behavior is normal.  Nursing note and vitals reviewed.   ED Course  Procedures (including critical care time) DIAGNOSTIC STUDIES: Oxygen Saturation is 98% on RA,  normal by my interpretation.    COORDINATION OF CARE: 5:23 PM Discussed treatment plan which includes pain management and discharge with abx to f/u with a dentist with pt at bedside and pt agreed to plan.  Labs Review Labs Reviewed - No data to display  Imaging Review No results found.   EKG Interpretation None      MDM   Final diagnoses:  Dentalgia   Patient presenting with tooth pain. No obvious abscess on exam. No soft tissue swelling of the cheek or neck. Exam unconcerning for Ludwig's angina or spread of infection.  Pain controlled in ED with vicodin. Will discharge with penicillin and encouraged pt to follow-up with dentist. Resource guide given in discharge paperwork. Return precautions discussed with pt and given in discharge paperwork. Stable for discharge.   I personally performed the services described in this documentation, which was scribed in my presence. The recorded information has been reviewed and is accurate.    Rolm Gala Raelee Rossmann, PA-C 07/25/15 1752  Derwood Kaplan, MD 07/26/15 1525

## 2015-07-25 NOTE — Discharge Instructions (Signed)
You will need to follow up with a dentist. Call Dr. Russella Dar or one from the resource guide.   Dental Pain Dental pain may be caused by many things, including:  Tooth decay (cavities or caries). Cavities expose the nerve of your tooth to air and hot or cold temperatures. This can cause pain or discomfort.  Abscess or infection. A dental abscess is a collection of infected pus from a bacterial infection in the inner part of the tooth (pulp). It usually occurs at the end of the tooth's root.  Injury.  An unknown reason (idiopathic). Your pain may be mild or severe. It may only occur when:  You are chewing.  You are exposed to hot or cold temperature.  You are eating or drinking sugary foods or beverages, such as soda or candy. Your pain may also be constant. HOME CARE INSTRUCTIONS Watch your dental pain for any changes. The following actions may help to lessen any discomfort that you are feeling:  Take medicines only as directed by your dentist.  If you were prescribed an antibiotic medicine, finish all of it even if you start to feel better.  Keep all follow-up visits as directed by your dentist. This is important.  Do not apply heat to the outside of your face.  Rinse your mouth or gargle with salt water if directed by your dentist. This helps with pain and swelling.  You can make salt water by adding  tsp of salt to 1 cup of warm water.  Apply ice to the painful area of your face:  Put ice in a plastic bag.  Place a towel between your skin and the bag.  Leave the ice on for 20 minutes, 2-3 times per day.  Avoid foods or drinks that cause you pain, such as:  Very hot or very cold foods or drinks.  Sweet or sugary foods or drinks. SEEK MEDICAL CARE IF:  Your pain is not controlled with medicines.  Your symptoms are worse.  You have new symptoms. SEEK IMMEDIATE MEDICAL CARE IF:  You are unable to open your mouth.  You are having trouble breathing or  swallowing.  You have a fever.  Your face, neck, or jaw is swollen.   This information is not intended to replace advice given to you by your health care provider. Make sure you discuss any questions you have with your health care provider.   Document Released: 05/02/2005 Document Revised: 09/16/2014 Document Reviewed: 04/28/2014 Elsevier Interactive Patient Education 2016 [employer].  [employer] Guide Dental [employer] is a great source of information about community services available.  Access by dialing 2-1-1 from anywhere in [place], or by website -  PooledIncome.pl.   Other Local Resources (Updated 05/2015)  Dental  Care   Services    Phone Number and Address  Cost  [facility] For children 56 - 73 years of age:   Cleaning  Tooth brushing/flossing instruction  Sealants, fillings, crowns  Extractions  Emergency treatment  [phone] [address] [place], [place] [address] Charges based on family income.  Medicaid and some insurance plans accepted.     [facility] - [facility], fillings, crowns  Extractions  Emergency treatment [phone] [address] [place], [place]  Pregnant women 60 years of age or older with a Medicaid card  [facility] - [place]  Cleaning  Sealants, fillings, crowns  Extractions  Emergency treatment [phone] [address] [place], [place] Pregnant women 43 years of age or older with a Medicaid card  [facility] For children 510 - 43 years of age:   Cleaning  Tooth brushing/flossing instruction  Sealants, fillings, crowns  Extractions  Emergency treatment Limited orthodontic services for patients with Medicaid [phone] [address] [place], [place] [address] Medicaid and [facility] Health Choice cover for  children up to age 10721 and pregnant women.  Parents of children up to age 43 without Medicaid pay a reduced fee at time of service.  [facility] [place] For children 670 - 43 years of age:   Cleaning  Tooth brushing/flossing instruction  Sealants, fillings, crowns  Extractions  Emergency treatment Limited orthodontic services for patients with Medicaid [phone] [address] [place], [place].  Medicaid and [place] Health Choice cover for children up to age 43 and pregnant women.  Parents of children up to age 43 without Medicaid pay a reduced fee.  [facility]  Cleaning  Sealants, fillings, crowns  Extractions  Hours: Tuesdays and Thursdays, 4:15 - 8 pm [phone] [address] [place], [place] [address] Services free of charge to [facility] residents ages 18-64 who do not have health insurance, Medicare, [place], or [place] benefits and fall within federal poverty guidelines  [facility]    Provides dental care in addition to primary medical care, nutritional counseling, and pharmacy:  Nurse, mental healthCleaning  Sealants, fillings, crowns  Extractions                  [phone] [facility], [address] [place], [place]  [phone] [facility], [address] [place], [place]  [phone] [facility] [place], [place]  [phone] [facility], [address] [place], [place]  [phone] [facility] [address] [place], [place] Accepts [place], [place], most insurance.  Also provides services available to all with fees adjusted based on ability to pay.    [facility]  Cleaning  Tooth brushing/flossing instruction  Sealants, fillings, crowns  Extractions  Emergency treatment Hours: Tuesdays,  Thursdays, and Fridays from 8 am to 5 pm by appointment only. [phone] [address] [place], [place] [phone] [facility] residents with Medicaid (depending on eligibility) and children with [facility] Health Choice - call for more information.  [facility]  Extractions only  Hours: 2nd and 4th Thursday of each month from 6:30 am - 9 am.   [phone] [place], [place] [address] Ages 6418 and older only.  Patients are seen on a first come, first served basis.  [employer]  [employer]  Fillings  Extractions  Orthodontics  Endodontics  Implants/Crowns/Bridges  Complete and partial dentures [phone] [place], [place] Patients must complete an application for services.  There is often a waiting list.

## 2015-07-25 NOTE — ED Notes (Signed)
Declined W/C at D/C and was escorted to lobby by RN. 

## 2015-12-28 ENCOUNTER — Encounter (HOSPITAL_COMMUNITY): Payer: Self-pay | Admitting: Emergency Medicine

## 2015-12-28 ENCOUNTER — Ambulatory Visit (HOSPITAL_COMMUNITY)
Admission: EM | Admit: 2015-12-28 | Discharge: 2015-12-28 | Disposition: A | Discharge status: Home / Self Care | Payer: BLUE CROSS/BLUE SHIELD | Attending: Family Medicine | Admitting: Family Medicine

## 2015-12-28 DIAGNOSIS — M25511 Pain in right shoulder: Secondary | ICD-10-CM

## 2015-12-28 DIAGNOSIS — M7581 Other shoulder lesions, right shoulder: Secondary | ICD-10-CM

## 2015-12-28 MED ORDER — KETOROLAC TROMETHAMINE 60 MG/2ML IM SOLN
INTRAMUSCULAR | Status: AC
  Filled 2015-12-28: qty 2

## 2015-12-28 MED ORDER — KETOROLAC TROMETHAMINE 60 MG/2ML IM SOLN
60.0000 mg | Freq: Once | INTRAMUSCULAR | Status: AC
  Administered 2015-12-28: 60 mg via INTRAMUSCULAR

## 2015-12-28 MED ORDER — NAPROXEN 500 MG PO TABS: 500.0000 mg | ORAL_TABLET | Freq: Two times a day (BID) | ORAL | 0 refills | Status: DC

## 2015-12-28 NOTE — ED Provider Notes (Signed)
CSN: [id number]     Arrival date & time 12/28/15  1111 History   First MD Initiated Contact with Patient 12/28/15 1149     Chief Complaint  Patient presents with  . Shoulder Pain   (Consider location/radiation/quality/duration/timing/severity/associated sxs/prior Treatment) Patient c/o right shoulder pain.   The history is provided by the patient.  Shoulder Pain  Location:  Shoulder Shoulder location:  R shoulder Injury: no   Pain details:    Quality:  Aching   Radiates to:  R shoulder   Severity:  Moderate   Onset quality:  Sudden   Duration:  2 days   Timing:  Constant   Progression:  Unchanged Handedness:  Right-handed Dislocation: no   Foreign body present:  No foreign bodies Tetanus status:  Unknown Prior injury to area:  No Relieved by:  Nothing Worsened by:  Nothing Ineffective treatments:  None tried   History reviewed. No pertinent past medical history. History reviewed. No pertinent surgical history. No family history on file. Social History  Substance Use Topics  . Smoking status: Current Every Day Smoker  . Smokeless tobacco: Never Used  . Alcohol use Yes    Review of Systems  Constitutional: Negative.   HENT: Negative.   Eyes: Negative.   Respiratory: Negative.   Cardiovascular: Negative.   Gastrointestinal: Negative.   Endocrine: Negative.   Genitourinary: Negative.   Musculoskeletal: Positive for arthralgias.  Skin: Negative.   Allergic/Immunologic: Negative.   Neurological: Negative.   Hematological: Negative.   Psychiatric/Behavioral: Negative.     Allergies  Review of patient's allergies indicates no known allergies.  Home Medications   Prior to Admission medications   Medication Sig Start Date End Date Taking? Authorizing Provider  amoxicillin-clavulanate (AUGMENTIN) 875-125 MG per tablet Take 1 tablet by mouth 2 (two) times daily. One po bid x 10 days 01/08/13   [name], MD  naproxen (NAPROSYN) 500 MG tablet Take 1 tablet  (500 mg total) by mouth 2 (two) times daily. 02/23/15   [name], PA-C  naproxen (NAPROSYN) 500 MG tablet Take 1 tablet (500 mg total) by mouth 2 (two) times daily. 07/25/15   Stevi Barrett, PA-C  naproxen (NAPROSYN) 500 MG tablet Take 1 tablet (500 mg total) by mouth 2 (two) times daily with a meal. 12/28/15   Deatra CanterWilliam J Virginie Josten, FNP  penicillin v potassium (VEETID) 500 MG tablet Take 1 tablet (500 mg total) by mouth 4 (four) times daily. 01/28/14   [name], MD  traMADol (ULTRAM) 50 MG tablet Take 1 tablet (50 mg total) by mouth every 6 (six) hours as needed. 02/23/15   Santiago GladHeather Laisure, PA-C   Meds Ordered and Administered this Visit   Medications  ketorolac (TORADOL) injection 60 mg (not administered)    BP 119/67 (BP Location: Left Arm)   Pulse 63   Temp 98.3 F (36.8 C) (Oral)   Resp 16   SpO2 98%  No data found.   Physical Exam  Constitutional: He is oriented to person, place, and time. He appears well-developed and well-nourished.  HENT:  Head: Normocephalic and atraumatic.  Right Ear: External ear normal.  Left Ear: External ear normal.  Nose: Nose normal.  Mouth/Throat: Oropharynx is clear and moist.  Eyes: Conjunctivae and EOM are normal. Pupils are equal, round, and reactive to light.  Neck: Normal range of motion. Neck supple.  Cardiovascular: Normal rate, regular rhythm, normal heart sounds and intact distal pulses.   Pulmonary/Chest: Effort normal and breath sounds normal.  Neurological: He  is alert and oriented to person, place, and time.  Nursing note and vitals reviewed.   Urgent Care Course   Clinical Course    Procedures (including critical care time)  Labs Review Labs Reviewed - No data to display  Imaging Review No results found.   Visual Acuity Review  Right Eye Distance:   Left Eye Distance:   Bilateral Distance:    Right Eye Near:   Left Eye Near:    Bilateral Near:         MDM   1. Shoulder pain, right   2. Rotator  cuff tendinitis, right    Toradol 60mg  IM Naprosyn 500mg  one po bid x 10 days #20      Deatra CanterWilliam J Chayton Murata, FNP 12/28/15 1244    Deatra CanterWilliam J Audie Stayer, FNP 12/28/15 1253

## 2015-12-28 NOTE — ED Triage Notes (Signed)
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

## 2017-06-19 ENCOUNTER — Emergency Department (HOSPITAL_COMMUNITY)
Admission: EM | Admit: 2017-06-19 | Discharge: 2017-06-19 | Disposition: A | Discharge status: Home / Self Care | Payer: BLUE CROSS/BLUE SHIELD | Attending: Emergency Medicine | Admitting: Emergency Medicine

## 2017-06-19 ENCOUNTER — Other Ambulatory Visit: Payer: Self-pay

## 2017-06-19 ENCOUNTER — Encounter (HOSPITAL_COMMUNITY): Payer: Self-pay | Admitting: *Deleted

## 2017-06-19 DIAGNOSIS — B356 Tinea cruris: Secondary | ICD-10-CM | POA: Insufficient documentation

## 2017-06-19 DIAGNOSIS — F17200 Nicotine dependence, unspecified, uncomplicated: Secondary | ICD-10-CM | POA: Insufficient documentation

## 2017-06-19 MED ORDER — CLOTRIMAZOLE 1 % EX CREA: TOPICAL_CREAM | CUTANEOUS | 0 refills | Status: DC

## 2017-06-19 NOTE — ED Provider Notes (Signed)
[facility] EMERGENCY DEPARTMENT Provider Note   CSN: [id number] Arrival date & time: 06/19/17  4098     History   Chief Complaint Chief Complaint  Patient presents with  . Rash    HPI Rodney Foley is a 47 y.o. male.  HPI   Rodney Foley is a 47 year old with no significant past medical history who presents to the emergency department for evaluation of groin itching.  He states this began about three weeks ago.  Reports an itchy, well demarcated erythematous rash in bilateral groin area.  Has tried cortisone cream without significant relief. Denies fevers, chills, groin tenderness, penile lesion or rash, testicular swelling/pain.  Denies new lotions, soaps, detergents.  Has never had this problem before.  History reviewed. No pertinent past medical history.  There are no active problems to display for this patient.   History reviewed. No pertinent surgical history.     Home Medications    Prior to Admission medications   Medication Sig Start Date End Date Taking? Authorizing Provider  clotrimazole (LOTRIMIN) 1 % cream Apply to affected area 2 times daily 06/19/17   Kellie Shropshire, PA-C  naproxen (NAPROSYN) 500 MG tablet Take 1 tablet (500 mg total) by mouth 2 (two) times daily. Patient not taking: Reported on 06/19/2017 02/23/15   [name], PA-C  naproxen (NAPROSYN) 500 MG tablet Take 1 tablet (500 mg total) by mouth 2 (two) times daily. Patient not taking: Reported on 06/19/2017 07/25/15   Barrett, Rolm Gala, PA-C  naproxen (NAPROSYN) 500 MG tablet Take 1 tablet (500 mg total) by mouth 2 (two) times daily with a meal. Patient not taking: Reported on 06/19/2017 12/28/15   Deatra Canter, FNP  traMADol (ULTRAM) 50 MG tablet Take 1 tablet (50 mg total) by mouth every 6 (six) hours as needed. Patient not taking: Reported on 06/19/2017 02/23/15   [name], PA-C    Family History History reviewed. No pertinent family history.  Social  History Social History   Tobacco Use  . Smoking status: Current Every Day Smoker  . Smokeless tobacco: Never Used  Substance Use Topics  . Alcohol use: Yes  . Drug use: No     Allergies   Patient has no known allergies.   Review of Systems Review of Systems  Constitutional: Negative for chills and fever.  Musculoskeletal: Negative for arthralgias.  Skin: Positive for color change (erythematous) and rash (groin area).     Physical Exam Updated Vital Signs BP (!) 140/97 (BP Location: Right Arm)   Pulse 72   Temp 98.2 F (36.8 C) (Oral)   Resp 18   SpO2 97%   Physical Exam  Constitutional: He appears well-developed and well-nourished. No distress.  HENT:  Head: Normocephalic and atraumatic.  Eyes: Right eye exhibits no discharge. Left eye exhibits no discharge.  Pulmonary/Chest: Effort normal. No respiratory distress.  Neurological: He is alert. Coordination normal.  Skin: Skin is warm and dry. He is not diaphoretic.  Bilateral groin area with slightly elevated, well-demarcated erythematous patch with central clearing. No overlying tenderness or warmth. No vesicles.  Psychiatric: He has a normal mood and affect. His behavior is normal.  Nursing note and vitals reviewed.    ED Treatments / Results  Labs (all labs ordered are listed, but only abnormal results are displayed) Labs Reviewed - No data to display  EKG  EKG Interpretation None       Radiology No results found.  Procedures Procedures (including critical care time)  Medications  Ordered in ED Medications - No data to display   Initial Impression / Assessment and Plan / ED Course  I have reviewed the triage vital signs and the nursing notes.  Pertinent labs & imaging results that were available during my care of the patient were reviewed by me and considered in my medical decision making (see chart for details).    Presentation consistent with tinea cruris. No blisters, no pustules, no  warmth, no draining sinus tracts, no superficial abscesses, no bullous impetigo, no vesicles, no desquamation, no target lesions with dusky purpura or a central bulla. Not tender to touch. No concern for superimposed infection. No concern for SJS, TEN, TSS, tick borne illness, syphilis or other life-threatening condition. Will treat with topical antifungal medication and benadryl as needed for pruritus.   Final Clinical Impressions(s) / ED Diagnoses   Final diagnoses:  Tinea cruris    ED Discharge Orders        Ordered    clotrimazole (LOTRIMIN) 1 % cream     06/19/17 1218       Kellie ShropshireShrosbree, Ivaan Liddy J, PA-C 06/19/17 1226    Raeford RazorKohut, Stephen, MD 06/20/17 [phone]

## 2017-06-19 NOTE — ED Triage Notes (Signed)
Pt reports having a rash on inner thighs for several days and causing itching. No acute distress is noted at triage.

## 2017-06-19 NOTE — Discharge Instructions (Signed)
You have fungal rash.  Please apply antifungal cream to the affected area twice a day.  You can take Benadryl as needed for itching.  Return to emergency department if you have fever, chills or have worsening redness and pain in the groin.

## 2017-08-28 ENCOUNTER — Other Ambulatory Visit: Payer: Self-pay

## 2017-08-28 ENCOUNTER — Emergency Department (HOSPITAL_COMMUNITY)
Admission: EM | Admit: 2017-08-28 | Discharge: 2017-08-28 | Disposition: A | Discharge status: Home / Self Care | Payer: Self-pay | Attending: Emergency Medicine | Admitting: Emergency Medicine

## 2017-08-28 ENCOUNTER — Encounter (HOSPITAL_COMMUNITY): Payer: Self-pay | Admitting: Emergency Medicine

## 2017-08-28 ENCOUNTER — Emergency Department (HOSPITAL_COMMUNITY): Payer: Self-pay

## 2017-08-28 DIAGNOSIS — Z79899 Other long term (current) drug therapy: Secondary | ICD-10-CM | POA: Insufficient documentation

## 2017-08-28 DIAGNOSIS — F17200 Nicotine dependence, unspecified, uncomplicated: Secondary | ICD-10-CM | POA: Insufficient documentation

## 2017-08-28 DIAGNOSIS — L03116 Cellulitis of left lower limb: Secondary | ICD-10-CM | POA: Insufficient documentation

## 2017-08-28 MED ORDER — OXYCODONE-ACETAMINOPHEN 5-325 MG PO TABS
1.0000 | ORAL_TABLET | Freq: Once | ORAL | Status: AC
  Administered 2017-08-28: 1 via ORAL
  Filled 2017-08-28: qty 1

## 2017-08-28 MED ORDER — CEPHALEXIN 500 MG PO CAPS: 500.0000 mg | ORAL_CAPSULE | Freq: Four times a day (QID) | ORAL | 0 refills | Status: DC

## 2017-08-28 MED ORDER — NAPROXEN 500 MG PO TABS: 500.0000 mg | ORAL_TABLET | Freq: Two times a day (BID) | ORAL | 0 refills | Status: DC
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
## 2017-08-28 NOTE — Discharge Instructions (Addendum)
You were seen in the emergency department today for a wound that appears infected.  The x-ray did not show any abnormalities with your bones.  We are starting on antibiotic, Keflex, take this 4 times per day for the next 7 days.  We additionally have given you a prescription for naproxen, take this once in the morning and once in the evening for pain and swelling. Naproxen is a nonsteroidal anti-inflammatory medication that will help with pain and swelling. Be sure to take this medication as prescribed with food, 1 pill every 12 hours,  It should be taken with food, as it can cause stomach upset, and more seriously, stomach bleeding. Do not take other nonsteroidal anti-inflammatory medications with this such as Advil, Motrin, or Aleve.   We have prescribed you new medication(s) today. Discuss the medications prescribed today with your pharmacist as they can have adverse effects and interactions with your other medicines including over the counter and prescribed medications. Seek medical evaluation if you start to experience new or abnormal symptoms after taking one of these medicines, seek care immediately if you start to experience difficulty breathing, feeling of your throat closing, facial swelling, or rash as these could be indications of a more serious allergic reaction    Follow-up with your primary care doctor in 3-5 days for reevaluation.  Return to the ER for any new or worsening symptoms including but not limited to spreading redness, worsening swelling, worsening pain, fever (temperature greater than 100.4), or any other concerns that you may have.

## 2017-08-28 NOTE — ED Notes (Signed)
Patient transported to X-ray 
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
## 2020-03-03 ENCOUNTER — Observation Stay (HOSPITAL_COMMUNITY)
Admission: EM | Admit: 2020-03-03 | Discharge: 2020-03-05 | Disposition: A | Discharge status: Home / Self Care | Payer: Self-pay | Attending: Internal Medicine | Admitting: Internal Medicine

## 2020-03-03 ENCOUNTER — Other Ambulatory Visit: Payer: Self-pay

## 2020-03-03 ENCOUNTER — Encounter (HOSPITAL_COMMUNITY): Payer: Self-pay | Admitting: Emergency Medicine

## 2020-03-03 DIAGNOSIS — E1165 Type 2 diabetes mellitus with hyperglycemia: Principal | ICD-10-CM

## 2020-03-03 DIAGNOSIS — F17200 Nicotine dependence, unspecified, uncomplicated: Secondary | ICD-10-CM | POA: Insufficient documentation

## 2020-03-03 DIAGNOSIS — R739 Hyperglycemia, unspecified: Secondary | ICD-10-CM

## 2020-03-03 DIAGNOSIS — Z20822 Contact with and (suspected) exposure to covid-19: Secondary | ICD-10-CM | POA: Insufficient documentation

## 2020-03-03 HISTORY — DX: Other specified health status: Z78.9

## 2020-03-03 HISTORY — DX: Type 2 diabetes mellitus without complications: E11.9

## 2020-03-03 LAB — BASIC METABOLIC PANEL
Anion gap: 12 (ref 5–15)
BUN: 10 mg/dL (ref 6–20)
CO2: 24 mmol/L (ref 22–32)
Calcium: 9.6 mg/dL (ref 8.9–10.3)
Chloride: 85 mmol/L — ABNORMAL LOW (ref 98–111)
Creatinine, Ser: 1.24 mg/dL (ref 0.61–1.24)
GFR, Estimated: >60 mL/min (ref >60)
Glucose, Bld: 922 mg/dL (ref 70–99)
Potassium: 4.5 mmol/L (ref 3.5–5.1)
Sodium: 121 mmol/L — ABNORMAL LOW (ref 135–145)

## 2020-03-03 LAB — URINALYSIS, ROUTINE W REFLEX MICROSCOPIC
Bacteria, UA: NONE SEEN
Bilirubin Urine: NEGATIVE
Glucose, UA: >=500 mg/dL — AB
Hgb urine dipstick: NEGATIVE
Ketones, ur: NEGATIVE mg/dL
Leukocytes,Ua: NEGATIVE
Nitrite: NEGATIVE
Protein, ur: NEGATIVE mg/dL
Specific Gravity, Urine: 1.028 (ref 1.005–1.030)
pH: 5 (ref 5.0–8.0)

## 2020-03-03 LAB — CBC
HCT: 46.3 % (ref 39.0–52.0)
Hemoglobin: 16.1 g/dL (ref 13.0–17.0)
MCH: 29.8 pg (ref 26.0–34.0)
MCHC: 34.8 g/dL (ref 30.0–36.0)
MCV: 85.7 fL (ref 80.0–100.0)
Platelets: 297 10*3/uL (ref 150–400)
RBC: 5.4 MIL/uL (ref 4.22–5.81)
RDW: 12 % (ref 11.5–15.5)
WBC: 9.3 10*3/uL (ref 4.0–10.5)
nRBC: 0 % (ref 0.0–0.2)

## 2020-03-03 MED ORDER — SODIUM CHLORIDE 0.9 % IV BOLUS
1000.0000 mL | Freq: Once | INTRAVENOUS | Status: AC
  Administered 2020-03-04: 1000 mL via INTRAVENOUS

## 2020-03-03 NOTE — ED Notes (Signed)
Critical Glucose lab of 922 result given to Dr. Pilar Plate.  Needs a bed.

## 2020-03-03 NOTE — ED Triage Notes (Signed)
Pt reports dizziness, general weakness and blurred vision X4 days.  His mother believes he has "high sugar or stress."  Negative NIH screening.

## 2020-03-03 NOTE — ED Provider Notes (Signed)
[facility] EMERGENCY DEPARTMENT Provider Note   CSN: [id number] Arrival date & time: 03/03/20  1934     History Chief Complaint  Patient presents with  . Dizziness  . Blurred Vision    Durrell Barajas is a 49 y.o. male.  The history is provided by the patient and medical records.  Dizziness Associated symptoms: weakness     49 y.o. M with no significant PMH presenting to the ED for blurred vision, urinary frequency, generalized weakness, and fatigue over the past 3-5 days.  States symptoms seemed a little worse today and he became concerned.  He does admit to frequent thirst and urination.  No fever, chills, or recent illness.  No history of DM but reports older sister and mother both have IDDM.  States he had a physical earlier this year and labs/glucose was normal at that time per his report.    History reviewed. No pertinent past medical history.  There are no problems to display for this patient.   History reviewed. No pertinent surgical history.     No family history on file.  Social History   Tobacco Use  . Smoking status: Current Every Day Smoker  . Smokeless tobacco: Never Used  Substance Use Topics  . Alcohol use: Yes  . Drug use: No    Home Medications Prior to Admission medications   Medication Sig Start Date End Date Taking? Authorizing Provider  cephALEXin (KEFLEX) 500 MG capsule Take 1 capsule (500 mg total) by mouth 4 (four) times daily. Patient not taking: Reported on 03/03/2020 08/28/17   Petrucelli, Pleas Koch, PA-C  clotrimazole (LOTRIMIN) 1 % cream Apply to affected area 2 times daily Patient not taking: Reported on 03/03/2020 06/19/17   Kellie Shropshire, PA-C  naproxen (NAPROSYN) 500 MG tablet Take 1 tablet (500 mg total) by mouth 2 (two) times daily. Patient not taking: Reported on 03/03/2020 08/28/17   Petrucelli, Pleas Koch, PA-C    Allergies    Patient has no known allergies.  Review of Systems   Review of Systems    Endocrine: Positive for polydipsia and polyuria.  Neurological: Positive for dizziness and weakness.  All other systems reviewed and are negative.   Physical Exam Updated Vital Signs BP 130/87 (BP Location: Left Arm)   Pulse 95   Temp 98.2 F (36.8 C) (Oral)   Resp 18   Ht 5\' 10"  (1.778 m)   Wt 117.9 kg   SpO2 94%   BMI 37.31 kg/m   Physical Exam Vitals and nursing note reviewed.  Constitutional:      Appearance: He is well-developed.  HENT:     Head: Normocephalic and atraumatic.     Mouth/Throat:     Comments: Dry mucous membranes Eyes:     Conjunctiva/sclera: Conjunctivae normal.     Pupils: Pupils are equal, round, and reactive to light.  Cardiovascular:     Rate and Rhythm: Normal rate and regular rhythm.     Heart sounds: Normal heart sounds.  Pulmonary:     Effort: Pulmonary effort is normal. No respiratory distress.     Breath sounds: Normal breath sounds. No rhonchi.  Abdominal:     General: Bowel sounds are normal.     Palpations: Abdomen is soft.     Tenderness: There is no abdominal tenderness. There is no rebound.  Musculoskeletal:        General: Normal range of motion.     Cervical back: Normal range of motion.  Skin:  General: Skin is warm and dry.     Coloration: Skin is not pale.     Findings: No bruising.  Neurological:     Mental Status: He is alert and oriented to person, place, and time.     Comments: AAOx3, answering questions and following commands appropriately; equal strength UE and LE bilaterally; CN grossly intact; moves all extremities appropriately without ataxia; no focal neuro deficits or facial asymmetry appreciated, speech clear and goal oriented     ED Results / Procedures / Treatments   Labs (all labs ordered are listed, but only abnormal results are displayed) Labs Reviewed  BASIC METABOLIC PANEL - Abnormal; Notable for the following components:      Result Value   Sodium 121 (*)    Chloride 85 (*)    Glucose, Bld  922 (*)    All other components within normal limits  URINALYSIS, ROUTINE W REFLEX MICROSCOPIC - Abnormal; Notable for the following components:   Color, Urine COLORLESS (*)    Glucose, UA >=500 (*)    All other components within normal limits  BETA-HYDROXYBUTYRIC ACID - Abnormal; Notable for the following components:   Beta-Hydroxybutyric Acid 0.40 (*)    All other components within normal limits  CBG MONITORING, ED - Abnormal; Notable for the following components:   Glucose-Capillary 510 (*)    All other components within normal limits  I-STAT VENOUS BLOOD GAS, ED - Abnormal; Notable for the following components:   pH, Ven 7.431 (*)    Bicarbonate 31.1 (*)    TCO2 33 (*)    Acid-Base Excess 6.0 (*)    Sodium 130 (*)    All other components within normal limits  CBG MONITORING, ED - Abnormal; Notable for the following components:   Glucose-Capillary 385 (*)    All other components within normal limits  RESPIRATORY PANEL BY RT PCR (FLU A&B, COVID)  CBC    EKG EKG Interpretation  Date/Time:  Tuesday March 03 2020 20:01:24 EDT Ventricular Rate:  101 PR Interval:  156 QRS Duration: 68 QT Interval:  386 QTC Calculation: 500 R Axis:   44 Text Interpretation: Sinus tachycardia Otherwise normal ECG No previous ECGs available Confirmed by [name] [phone]) on 03/03/2020 9:32:18 PM   Radiology No results found.  Procedures Procedures (including critical care time)  CRITICAL CARE Performed by: Garlon Hatchet   Total critical care time: 45 minutes  Critical care time was exclusive of separately billable procedures and treating other patients.  Critical care was necessary to treat or prevent imminent or life-threatening deterioration.  Critical care was time spent personally by me on the following activities: development of treatment plan with patient and/or surrogate as well as nursing, discussions with consultants, evaluation of patient's response to treatment,  examination of patient, obtaining history from patient or surrogate, ordering and performing treatments and interventions, ordering and review of laboratory studies, ordering and review of radiographic studies, pulse oximetry and re-evaluation of patient's condition.   Medications Ordered in ED Medications  insulin regular, human (MYXREDLIN) 100 units/ 100 mL infusion (11 Units/hr Intravenous Rate/Dose Change 03/04/20 0130)  dextrose 5 % in lactated ringers infusion (has no administration in time range)  dextrose 50 % solution 0-50 mL (has no administration in time range)  0.9 %  sodium chloride infusion ( Intravenous New Bag/Given 03/04/20 0113)  sodium chloride 0.9 % bolus 1,000 mL (0 mLs Intravenous Stopped 03/04/20 0113)  sodium chloride 0.9 % bolus 1,000 mL (0 mLs Intravenous Stopped  03/04/20 0113)    ED Course  I have reviewed the triage vital signs and the nursing notes.  Pertinent labs & imaging results that were available during my care of the patient were reviewed by me and considered in my medical decision making (see chart for details).    MDM Rules/Calculators/A&P  49 year old male presenting to the ED with several days of blurred vision, fatigue, and generalized weakness. He does admit to frequent thirst and urination. He is afebrile and nontoxic. Neurologic exam is nonfocal. Labs with glucose of 922. He has no history of diabetes. His anion gap actually remains normal at 12 and bicarb is normal at 24. UA without any noted ketones. VBG is also reassuring. Clinically this is not DKA but hyperglycemic crisis. Patient was given fluid bolus and started on insulin drip with good response.  Discussed with hospitalist, Dr. Antionette Char, regarding IP VS OP management given last CBG much better controlled at 385.  Given he has no PCP, no insurance, and has never had known DM before, we will observe and have him meet with diabetes coordinator in AM and hopefully arrange sufficient  follow-up.  Final Clinical Impression(s) / ED Diagnoses Final diagnoses:  Hyperglycemia    Rx / DC Orders ED Discharge Orders    None       Garlon Hatchet, PA-C 03/04/20 0157    Charlynne Pander, MD 03/04/20 [phone]

## 2020-03-04 ENCOUNTER — Encounter (HOSPITAL_COMMUNITY): Payer: Self-pay | Admitting: Family Medicine

## 2020-03-04 DIAGNOSIS — E1165 Type 2 diabetes mellitus with hyperglycemia: Secondary | ICD-10-CM

## 2020-03-04 LAB — BASIC METABOLIC PANEL
Anion gap: 8 (ref 5–15)
BUN: 8 mg/dL (ref 6–20)
CO2: 27 mmol/L (ref 22–32)
Calcium: 9 mg/dL (ref 8.9–10.3)
Chloride: 101 mmol/L (ref 98–111)
Creatinine, Ser: 0.95 mg/dL (ref 0.61–1.24)
GFR, Estimated: >60 mL/min (ref >60)
Glucose, Bld: 195 mg/dL — ABNORMAL HIGH (ref 70–99)
Potassium: 3.3 mmol/L — ABNORMAL LOW (ref 3.5–5.1)
Sodium: 136 mmol/L (ref 135–145)

## 2020-03-04 LAB — CBG MONITORING, ED
Glucose-Capillary: 150 mg/dL — ABNORMAL HIGH (ref 70–99)
Glucose-Capillary: 162 mg/dL — ABNORMAL HIGH (ref 70–99)
Glucose-Capillary: 170 mg/dL — ABNORMAL HIGH (ref 70–99)
Glucose-Capillary: 177 mg/dL — ABNORMAL HIGH (ref 70–99)
Glucose-Capillary: 249 mg/dL — ABNORMAL HIGH (ref 70–99)
Glucose-Capillary: 250 mg/dL — ABNORMAL HIGH (ref 70–99)
Glucose-Capillary: 325 mg/dL — ABNORMAL HIGH (ref 70–99)
Glucose-Capillary: 385 mg/dL — ABNORMAL HIGH (ref 70–99)
Glucose-Capillary: 510 mg/dL (ref 70–99)

## 2020-03-04 LAB — I-STAT VENOUS BLOOD GAS, ED
Acid-Base Excess: 6 mmol/L — ABNORMAL HIGH (ref 0.0–2.0)
Bicarbonate: 31.1 mmol/L — ABNORMAL HIGH (ref 20.0–28.0)
Calcium, Ion: 1.17 mmol/L (ref 1.15–1.40)
HCT: 50 % (ref 39.0–52.0)
Hemoglobin: 17 g/dL (ref 13.0–17.0)
O2 Saturation: 67 %
Potassium: 4.7 mmol/L (ref 3.5–5.1)
Sodium: 130 mmol/L — ABNORMAL LOW (ref 135–145)
TCO2: 33 mmol/L — ABNORMAL HIGH (ref 22–32)
pCO2, Ven: 46.7 mmHg (ref 44.0–60.0)
pH, Ven: 7.431 — ABNORMAL HIGH (ref 7.250–7.430)
pO2, Ven: 34 mmHg (ref 32.0–45.0)

## 2020-03-04 LAB — BETA-HYDROXYBUTYRIC ACID: Beta-Hydroxybutyric Acid: 0.4 mmol/L — ABNORMAL HIGH (ref 0.05–0.27)

## 2020-03-04 LAB — HIV ANTIBODY (ROUTINE TESTING W REFLEX): HIV Screen 4th Generation wRfx: NONREACTIVE

## 2020-03-04 LAB — PHOSPHORUS: Phosphorus: 4.2 mg/dL (ref 2.5–4.6)

## 2020-03-04 LAB — RESPIRATORY PANEL BY RT PCR (FLU A&B, COVID)
Influenza A by PCR: NEGATIVE
Influenza B by PCR: NEGATIVE
SARS Coronavirus 2 by RT PCR: NEGATIVE

## 2020-03-04 LAB — GLUCOSE, CAPILLARY: Glucose-Capillary: 226 mg/dL — ABNORMAL HIGH (ref 70–99)

## 2020-03-04 LAB — MAGNESIUM: Magnesium: 2.1 mg/dL (ref 1.7–2.4)

## 2020-03-04 MED ORDER — INSULIN STARTER KIT- PEN NEEDLES (ENGLISH)
1.0000 | Freq: Once | Status: DC
  Filled 2020-03-04 (×2): qty 1

## 2020-03-04 MED ORDER — DEXTROSE IN LACTATED RINGERS 5 % IV SOLN: INTRAVENOUS | Status: DC

## 2020-03-04 MED ORDER — LACTATED RINGERS IV SOLN: INTRAVENOUS | Status: DC

## 2020-03-04 MED ORDER — INSULIN REGULAR(HUMAN) IN NACL 100-0.9 UT/100ML-% IV SOLN: INTRAVENOUS | Status: DC

## 2020-03-04 MED ORDER — POTASSIUM CHLORIDE 2 MEQ/ML IV SOLN
INTRAVENOUS | Status: DC
  Filled 2020-03-04 (×2): qty 1000

## 2020-03-04 MED ORDER — INSULIN REGULAR(HUMAN) IN NACL 100-0.9 UT/100ML-% IV SOLN
INTRAVENOUS | Status: DC
  Administered 2020-03-04: 18 [IU]/h via INTRAVENOUS
  Filled 2020-03-04: qty 100

## 2020-03-04 MED ORDER — INSULIN ASPART 100 UNIT/ML ~~LOC~~ SOLN
10.0000 [IU] | Freq: Three times a day (TID) | SUBCUTANEOUS | Status: DC
  Administered 2020-03-04 – 2020-03-05 (×4): 10 [IU] via SUBCUTANEOUS

## 2020-03-04 MED ORDER — DEXTROSE 50 % IV SOLN: 0.0000 mL | INTRAVENOUS | Status: DC | PRN

## 2020-03-04 MED ORDER — ENOXAPARIN SODIUM 40 MG/0.4ML ~~LOC~~ SOLN
40.0000 mg | SUBCUTANEOUS | Status: DC
  Administered 2020-03-05: 40 mg via SUBCUTANEOUS
  Filled 2020-03-04: qty 0.4

## 2020-03-04 MED ORDER — POTASSIUM CHLORIDE CRYS ER 20 MEQ PO TBCR
40.0000 meq | EXTENDED_RELEASE_TABLET | Freq: Two times a day (BID) | ORAL | Status: DC
  Administered 2020-03-04 – 2020-03-05 (×3): 40 meq via ORAL
  Filled 2020-03-04 (×3): qty 2

## 2020-03-04 MED ORDER — ACETAMINOPHEN 325 MG PO TABS: 650.0000 mg | ORAL_TABLET | Freq: Four times a day (QID) | ORAL | Status: DC | PRN

## 2020-03-04 MED ORDER — SODIUM CHLORIDE 0.9 % IV SOLN: INTRAVENOUS | Status: DC

## 2020-03-04 MED ORDER — ONDANSETRON HCL 4 MG/2ML IJ SOLN: 4.0000 mg | Freq: Four times a day (QID) | INTRAMUSCULAR | Status: DC | PRN

## 2020-03-04 MED ORDER — INSULIN ASPART 100 UNIT/ML ~~LOC~~ SOLN
0.0000 [IU] | Freq: Three times a day (TID) | SUBCUTANEOUS | Status: DC
  Administered 2020-03-04: 7 [IU] via SUBCUTANEOUS
  Administered 2020-03-04 (×2): 2 [IU] via SUBCUTANEOUS
  Administered 2020-03-05: 9 [IU] via SUBCUTANEOUS
  Administered 2020-03-05: 5 [IU] via SUBCUTANEOUS

## 2020-03-04 MED ORDER — INSULIN DETEMIR 100 UNIT/ML ~~LOC~~ SOLN
0.3000 [IU]/kg | SUBCUTANEOUS | Status: DC
  Administered 2020-03-04: 35 [IU] via SUBCUTANEOUS
  Filled 2020-03-04 (×2): qty 0.35

## 2020-03-04 MED ORDER — INSULIN ASPART 100 UNIT/ML ~~LOC~~ SOLN
0.0000 [IU] | Freq: Every day | SUBCUTANEOUS | Status: DC
  Administered 2020-03-04: 2 [IU] via SUBCUTANEOUS

## 2020-03-04 MED ORDER — LIVING WELL WITH DIABETES BOOK
Freq: Once | Status: DC
  Filled 2020-03-04: qty 1

## 2020-03-04 NOTE — Progress Notes (Signed)
Pt demonstrated correct technique of insulin administration:  Cleansing skin with alcohol before, did not hesitate in administering needle into skin. Pt / wife have "Living with Diabetes" book and have been reading it so he already has some basic understanding. Sent message to Pharmacy to send insulin starter kit.

## 2020-03-04 NOTE — ED Notes (Signed)
Ordered breakfast 

## 2020-03-04 NOTE — Progress Notes (Signed)
Patient seen and examined.  Admitted early morning hours by nighttime hospitalist with new onset diabetes with blood sugars more than 900 but no anion gap.  Treated with IV insulin with appropriate response.  Plan: New onset diabetic with hyperglycemic crisis, no DKA Patient responded well to IV insulin, will change to subcu insulin with long-acting and prandial insulin. Patient does not have insurance coverage, will probably be best treated with 70/30 generic insulin, diabetic educator to see and give recommendations.  We will also add some oral hypoglycemics on discharge. Nursing to do bedside teaching, insulin techniques, hypoglycemic disease. Replace electrolytes. Mobilize, can go to MedSurg bed. If remains a stable, anticipate early morning discharge.

## 2020-03-04 NOTE — H&P (Signed)
History and Physical    Rodney Foley LNL:[id number] DOB: 01-29-1971 DOA: 03/03/2020  PCP: Patient, No Pcp Per   Patient coming from: Home   Chief Complaint: Blurred vision, general weakness   HPI: Rodney Foley is a 49 y.o. male with medical history significant for current smoker, now presenting to the emergency department with approximately 4 days of general weakness, blurred vision, polydipsia, and polyuria.  Patient reports that he is generally healthy, does not take any medications regularly, and has been cutting back on his tobacco use, and had been feeling well until approximately 4 days ago when he noticed the insidious development of general weakness, increased thirst, and frequent urination.  He also developed some blurred vision involving both eyes.  He denies any fevers, chills, shortness of breath, cough, chest pain, headache, or focal numbness or weakness.  Denies dysuria, suprapubic discomfort, or flank pain.  Reports a history of diabetes in 2 sisters.  He states that he does not have health insurance or PCP.  ED Course: Upon arrival to the ED, patient is found to be afebrile, saturating well on room air, slightly tachycardic, and with stable blood pressure.  EKG features sinus tachycardia with rate 101.  Chemistry panel is notable for glucose of 922 bicarbonate and normal anion gap.  CBC is unremarkable.  Urinalysis with glucosuria but no ketones.  Covid PCR is negative.  Patient was given 2 L of saline and started on insulin infusion in the ED.Marland Kitchen  Review of Systems:  All other systems reviewed and apart from HPI, are negative.  Past Medical History:  Diagnosis Date  . Medical history non-contributory     History reviewed. No pertinent surgical history.  Social History:   reports that he has been smoking. He has never used smokeless tobacco. He reports current alcohol use. He reports that he does not use drugs.  No Known Allergies  Family History  Problem Relation Age  of Onset  . Diabetes Sister      Prior to Admission medications   Not on File    Physical Exam: Vitals:   03/03/20 2230 03/03/20 2300 03/04/20 0015 03/04/20 0115  BP: 126/84 (!) 120/95 (!) 135/106 127/81  Pulse: 90 87 70 80  Resp: 20 17 17 17   Temp:      TempSrc:      SpO2: 94% 95% 93% 96%  Weight:      Height:         Constitutional: NAD, calm  Eyes: PERTLA, lids and conjunctivae normal ENMT: Mucous membranes are moist. Posterior pharynx clear of any exudate or lesions.   Neck: normal, supple, no masses, no thyromegaly Respiratory:  no wheezing, no crackles. No accessory muscle use.  Cardiovascular: S1 & S2 heard, regular rate and rhythm. No extremity edema.   Abdomen: No distension, no tenderness, soft. Bowel sounds active.  Musculoskeletal: no clubbing / cyanosis. No joint deformity upper and lower extremities.   Skin: no significant rashes, lesions, ulcers. Warm, dry, well-perfused. Neurologic: no facial asymmetry. Sensation intact. Moving all extremities.  Psychiatric: Alert and oriented to person, place, and situation. Very pleasant and cooperative.    Labs and Imaging on Admission: I have personally reviewed following labs and imaging studies  CBC: Recent Labs  Lab 03/03/20 2013 03/04/20 0020  WBC 9.3  --   HGB 16.1 17.0  HCT 46.3 50.0  MCV 85.7  --   PLT 297  --    Basic Metabolic Panel: Recent Labs  Lab 03/03/20 2013 03/04/20 0020  NA 121* 130*  K 4.5 4.7  CL 85*  --   CO2 24  --   GLUCOSE 922*  --   BUN 10  --   CREATININE 1.24  --   CALCIUM 9.6  --    GFR: Estimated Creatinine Clearance: 94.8 mL/min (by C-G formula based on SCr of 1.24 mg/dL). Liver Function Tests: No results for input(s): AST, ALT, ALKPHOS, BILITOT, PROT, ALBUMIN in the last 168 hours. No results for input(s): LIPASE, AMYLASE in the last 168 hours. No results for input(s): AMMONIA in the last 168 hours. Coagulation Profile: No results for input(s): INR, PROTIME in the  last 168 hours. Cardiac Enzymes: No results for input(s): CKTOTAL, CKMB, CKMBINDEX, TROPONINI in the last 168 hours. BNP (last 3 results) No results for input(s): PROBNP in the last 8760 hours. HbA1C: No results for input(s): HGBA1C in the last 72 hours. CBG: Recent Labs  Lab 03/04/20 0042 03/04/20 0128  GLUCAP 510* 385*   Lipid Profile: No results for input(s): CHOL, HDL, LDLCALC, TRIG, CHOLHDL, LDLDIRECT in the last 72 hours. Thyroid Function Tests: No results for input(s): TSH, T4TOTAL, FREET4, T3FREE, THYROIDAB in the last 72 hours. Anemia Panel: No results for input(s): VITAMINB12, FOLATE, FERRITIN, TIBC, IRON, RETICCTPCT in the last 72 hours. Urine analysis:    Component Value Date/Time   COLORURINE COLORLESS (A) 03/03/2020 1956   APPEARANCEUR CLEAR 03/03/2020 1956   LABSPEC 1.028 03/03/2020 1956   PHURINE 5.0 03/03/2020 1956   GLUCOSEU >=500 (A) 03/03/2020 1956   HGBUR NEGATIVE 03/03/2020 1956   BILIRUBINUR NEGATIVE 03/03/2020 1956   KETONESUR NEGATIVE 03/03/2020 1956   PROTEINUR NEGATIVE 03/03/2020 1956   NITRITE NEGATIVE 03/03/2020 1956   LEUKOCYTESUR NEGATIVE 03/03/2020 1956   Sepsis Labs: @LABRCNTIP (procalcitonin:4,lacticidven:4) ) Recent Results (from the past 240 hour(s))  Respiratory Panel by RT PCR (Flu A&B, Covid) - Nasopharyngeal Swab     Status: None   Collection Time: 03/04/20 12:24 AM   Specimen: Nasopharyngeal Swab  Result Value Ref Range Status   SARS Coronavirus 2 by RT PCR NEGATIVE NEGATIVE Final    Comment: (NOTE) SARS-CoV-2 target nucleic acids are NOT DETECTED.  The SARS-CoV-2 RNA is generally detectable in upper respiratoy specimens during the acute phase of infection. The lowest concentration of SARS-CoV-2 viral copies this assay can detect is 131 copies/mL. A negative result does not preclude SARS-Cov-2 infection and should not be used as the sole basis for treatment or other patient management decisions. A negative result may occur  with  improper specimen collection/handling, submission of specimen other than nasopharyngeal swab, presence of viral mutation(s) within the areas targeted by this assay, and inadequate number of viral copies (<131 copies/mL). A negative result must be combined with clinical observations, patient history, and epidemiological information. The expected result is Negative.  Fact Sheet for Patients:  03/06/20  Fact Sheet for Healthcare Providers:  https://www.moore.com/  This test is no t yet approved or cleared by the https://www.young.biz/ FDA and  has been authorized for detection and/or diagnosis of SARS-CoV-2 by FDA under an Emergency Use Authorization (EUA). This EUA will remain  in effect (meaning this test can be used) for the duration of the COVID-19 declaration under Section 564(b)(1) of the Act, 21 U.S.C. section 360bbb-3(b)(1), unless the authorization is terminated or revoked sooner.     Influenza A by PCR NEGATIVE NEGATIVE Final   Influenza B by PCR NEGATIVE NEGATIVE Final    Comment: (NOTE) The Xpert Xpress SARS-CoV-2/FLU/RSV assay is intended as an aid  in  the diagnosis of influenza from Nasopharyngeal swab specimens and  should not be used as a sole basis for treatment. Nasal washings and  aspirates are unacceptable for Xpert Xpress SARS-CoV-2/FLU/RSV  testing.  Fact Sheet for Patients: https://www.moore.com/  Fact Sheet for Healthcare Providers: https://www.young.biz/  This test is not yet approved or cleared by the [place] FDA and  has been authorized for detection and/or diagnosis of SARS-CoV-2 by  FDA under an Emergency Use Authorization (EUA). This EUA will remain  in effect (meaning this test can be used) for the duration of the  Covid-19 declaration under Section 564(b)(1) of the Act, 21  U.S.C. section 360bbb-3(b)(1), unless the authorization is  terminated or  revoked. Performed at [facility], [address] [place], [place] [address]      Radiological Exams on Admission: No results found.  EKG: Independently reviewed. Sinus tachycardia (rate 101).   Assessment/Plan  1. Uncontrolled DM with hyperglycemia, new-onset  - Presents with ~4 days blurred vision, polydipsia, and polyuria and is found to have serum glucose of 922 without acidosis, ketonuria, or confusion  - New-onset DM, does not appear to have been precipitated by acute illness or medication  - He was fluid-resuscitated in ED and started on insulin infusion  - Continue insulin infusion for now with frequent CBGs, continue IVF hydration, repeat chem panel, check A1c, consult with diabetes coordinator     DVT prophylaxis: Lovenox  Code Status: Full  Family Communication: Discussed with patient  Disposition Plan:  Patient is from: Home  Anticipated d/c is to: Home  Anticipated d/c date is: 03/05/20 Patient currently: on insulin infusion  Consults called: None  Admission status: Observation     Briscoe Deutscher, MD [facility]  03/04/2020, 2:26 AM

## 2020-03-04 NOTE — ED Notes (Signed)
Pt's CBG result was 250. Informed Clark - RN.

## 2020-03-04 NOTE — Progress Notes (Signed)
Inpatient Diabetes Program Recommendations  AACE/ADA: New Consensus Statement on Inpatient Glycemic Control (2015)  Target Ranges:  Prepandial:   less than 140 mg/dL      Peak postprandial:   less than 180 mg/dL (1-2 hours)      Critically ill patients:  140 - 180 mg/dL   Lab Results  Component Value Date   GLUCAP 250 (H) 03/04/2020    Review of Glycemic Control Results for JAHI, ROZA (MRN [id number]) as of 03/04/2020 12:08  Ref. Range 03/04/2020 06:52 03/04/2020 08:27 03/04/2020 09:43  Glucose-Capillary Latest Ref Range: 70 - 99 mg/dL 150 (H) 162 (H) 250 (H)   Diabetes history: New onset DM Outpatient Diabetes medications: none Current orders for Inpatient glycemic control: IV insulin to transition to Levemir 35 units QD, Novolog 10 units TID, Novolog 0-9 units TID, Novolog 0-5 units QHS  Inpatient Diabetes Program Recommendations:    Noted consult. Ordered LWWDM, dietitian, care order, insulin starter kit and TOC. Nursing to begin helping with self injection. Attempted to speak with patient, will reattempt once admitted with A1C result.  At discharge would consider Novolin 70/30 pending plan for outpatient follow up.  Following.   Thanks, Bronson Curb, MSN, RNC-OB Diabetes Coordinator [phone] (8a-5p)

## 2020-03-05 ENCOUNTER — Encounter (HOSPITAL_COMMUNITY): Payer: Self-pay | Admitting: Internal Medicine

## 2020-03-05 LAB — BASIC METABOLIC PANEL
Anion gap: 7 (ref 5–15)
BUN: 9 mg/dL (ref 6–20)
CO2: 25 mmol/L (ref 22–32)
Calcium: 8.5 mg/dL — ABNORMAL LOW (ref 8.9–10.3)
Chloride: 104 mmol/L (ref 98–111)
Creatinine, Ser: 1.09 mg/dL (ref 0.61–1.24)
GFR, Estimated: >60 mL/min (ref >60)
Glucose, Bld: 229 mg/dL — ABNORMAL HIGH (ref 70–99)
Potassium: 4.2 mmol/L (ref 3.5–5.1)
Sodium: 136 mmol/L (ref 135–145)

## 2020-03-05 LAB — HEMOGLOBIN A1C
Hgb A1c MFr Bld: >15.5 % — ABNORMAL HIGH (ref 4.8–5.6)
Mean Plasma Glucose: >398 mg/dL

## 2020-03-05 LAB — GLUCOSE, CAPILLARY
Glucose-Capillary: 192 mg/dL — ABNORMAL HIGH (ref 70–99)
Glucose-Capillary: 254 mg/dL — ABNORMAL HIGH (ref 70–99)
Glucose-Capillary: 362 mg/dL — ABNORMAL HIGH (ref 70–99)

## 2020-03-05 MED ORDER — METFORMIN HCL 500 MG PO TABS: 500.0000 mg | ORAL_TABLET | Freq: Two times a day (BID) | ORAL | 11 refills | Status: DC

## 2020-03-05 MED ORDER — INSULIN ASPART PROT & ASPART (70-30 MIX) 100 UNIT/ML PEN: 30.0000 [IU] | PEN_INJECTOR | Freq: Two times a day (BID) | SUBCUTANEOUS | 11 refills | Status: DC

## 2020-03-05 MED ORDER — "INSULIN SYRINGE 29G X 1/2"" 0.5 ML MISC": 1.0000 | Freq: Two times a day (BID) | 0 refills | Status: AC

## 2020-03-05 MED ORDER — BLOOD GLUCOSE METER KIT: PACK | 0 refills | Status: AC

## 2020-03-05 MED ORDER — INSULIN ASPART PROT & ASPART (70-30 MIX) 100 UNIT/ML ~~LOC~~ SUSP
30.0000 [IU] | Freq: Two times a day (BID) | SUBCUTANEOUS | Status: DC
  Administered 2020-03-05: 30 [IU] via SUBCUTANEOUS
  Filled 2020-03-05: qty 10

## 2020-03-05 NOTE — Plan of Care (Signed)
RD consulted for nutrition education regarding diabetes.   Lab Results  Component Value Date   HGBA1C >15.5 (H) 03/04/2020   PTA DM medications are none.   Labs reviewed: CBGS: 164-290 (inpatient orders for glycemic control are 0-5 units insulin aspart daily at bedtime, 0-9 units insulin aspart TID with meals, 10 units insulin glargine daily at bedtime, and 30 units insulin aspart protamine-aspart BID).    Spoke with pt at bedside, who was pleasant and in good spirits today. He reports that he is dealing well with his new diagnosis and is interested in learning about better caring for himself in order to feel better. Pt shares that PTA he was thinking of eating healthier and exercising more and now is more motivated to put these plans into place. Pt shares he has already met with DM coordinator and was able to teach back to this RD was was discussed during this visit. Pt feels comfortable with fingersticks and insulin injections ("I think I'll be a pro by the time I leave here today").   RD spent the majority of visit discussing survival skills and importance of diabetes self-management to improve glycemic control and prevent further complications. Encouraged pt to continue desire to decrease sugary beverages in diet and spoke about low calorie beverage options. Pt very engaged and had thoughtful ideas and questions.  RD provided "Carbohydrate Counting for People with Diabetes" handout from the Academy of Nutrition and Dietetics. Discussed different food groups and their effects on blood sugar, emphasizing carbohydrate-containing foods. Provided list of carbohydrates and recommended serving sizes of common foods.  Discussed importance of controlled and consistent carbohydrate intake throughout the day. Provided examples of ways to balance meals/snacks and encouraged intake of high-fiber, whole grain complex carbohydrates. Teach back method used.  Expect fair to good compliance.  Body mass index  is 37.31 kg/m. Pt meets criteria for obesity, class II based on current BMI.  Current diet order is carb modified, patient is consuming approximately 100% of meals at this time. Labs and medications reviewed. No further nutrition interventions warranted at this time. RD contact information provided. If additional nutrition issues arise, please re-consult RD.  Loistine Chance, RD, LDN, [place] Registered Dietitian II Certified Diabetes Care and Education Specialist Please refer to [facility] for RD and/or RD on-call/weekend/after hours pager

## 2020-03-05 NOTE — Progress Notes (Signed)
Pt feels confident in his DC to home, he wants to quit smoking and to be compliant with his diet, meds and follow up. All DC instructions reviewed and copy of DC instructions given. Pt has successfully demonstrated correct technique of insulin administration multiple times since being here on the dept. Pt understands follow up appointment for 11/11, his diet modification, how to keep a log book of CBGs and activities.  Printed Rx given. Reviewed foot care and importance of looking at feet daily.

## 2020-03-05 NOTE — Discharge Summary (Signed)
Physician Discharge Summary  Rodney Foley HGD:[id number] DOB: 1971/01/20 DOA: 03/03/2020  PCP: Patient, No Pcp Per  Admit date: 03/03/2020 Discharge date: 03/05/2020  Admitted From: Home Disposition: Home  Recommendations for Outpatient Follow-up:  1. Follow up with PCP as a scheduled on 11/11   Home Health: Not applicable Equipment/Devices: Not applicable  Discharge Condition: Stable CODE STATUS: Full code Diet recommendation: Low-carb diet  Discharge summary: Patient with no medical issues admitted with 4 days of polyuria, blurred vision and generalized weakness.  Upon arrival to the ER he was afebrile.  On room air.  He was slightly tachycardic.  Blood glucose was 922 with normal anion gap.  COVID-19 was negative.  Patient was treated with IV fluids and insulin infusion and admitted to the hospital.  Patient was diagnosed with hyperglycemic crisis without DKA, initially treated with insulin infusion and ultimately transition to subcu insulin.  Hemoglobin A1c more than 15.  Plan: Adequately stabilized.  Symptoms improved.  Still has some blurriness, he needs ophthalmology follow-up. Patient discharged on insulin 70/30, 30 units twice a day along with Metformin 500 mg daily Detailed education, diabetic teaching, hypoglycemic symptoms and monitoring discussed. Smoking cessation, lifestyle modifications and dietary changes discussed, information provided. He will keep a logbook of blood sugars at home and bring it to doctor's office for follow-up. Patient was comfortable to inject insulin himself before discharge.  Discharge Diagnoses:  Principal Problem:   Uncontrolled diabetes mellitus with hyperglycemia ([place]) Active Problems:   Hyperglycemia due to diabetes mellitus [facility])    Discharge Instructions  Discharge Instructions    Diet Carb Modified   Complete by: As directed    Discharge instructions   Complete by: As directed    Check your blood sugars at home and keep a  logbook, bring it to doctor's office visit. Make yourself aware about low blood sugars and treatment.   Increase activity slowly   Complete by: As directed      Allergies as of 03/05/2020   No Known Allergies     Medication List    TAKE these medications   blood glucose meter kit and supplies Dispense based on patient and insurance preference. Use up to four times daily as directed. (FOR ICD-10 E10.9, E11.9).   insulin aspart protamine - aspart (70-30) 100 UNIT/ML FlexPen Commonly known as: NOVOLOG 70/30 MIX Inject 0.3 mLs (30 Units total) into the skin 2 (two) times daily with a meal.   INSULIN SYRINGE .5CC/29G 29G X 1/2" 0.5 ML Misc 1 Syringe by Does not apply route in the morning and at bedtime.   metFORMIN 500 MG tablet Commonly known as: Glucophage Take 1 tablet (500 mg total) by mouth 2 (two) times daily with a meal.       Follow-up Information    [place] Go on 03/26/2020.   Why: You have a hospital follow up appointment with Dr. Chapman Fitch at 930am on 03/26/20.  Contact information: [address] [place] [address] [phone]             No Known Allergies  Consultations:  Diabetic educator   Procedures/Studies: No results found. (Echo, Carotid, EGD, Colonoscopy, ERCP)    Subjective: Patient seen and examined.  No overnight events.  He is ready to go home.  He learned how to do insulin injections.  He is agreeable to follow-up with [facility].   Discharge Exam: Vitals:   03/05/20 0233 03/05/20 0426  BP: 124/77 107/82  Pulse:  61 60  Resp: 17 16  Temp: 98 F (36.7 C) 97.7 F (36.5 C)  SpO2: 94% 98%   Vitals:   03/04/20 1829 03/04/20 2048 03/05/20 0233 03/05/20 0426  BP: 121/85 119/69 124/77 107/82  Pulse: 67 70 61 60  Resp: _0 Temp: 97.8 F (36.6 C) 98 F (36.7 C) 98 F (36.7 C) 97.7 F (36.5 C)  TempSrc: Oral   Oral  SpO2: 97% 98% 94% 98%  Weight:       Height:        General: Pt is alert, awake, not in acute distress Cardiovascular: RRR, S1/S2 +, no rubs, no gallops Respiratory: CTA bilaterally, no wheezing, no rhonchi Abdominal: Soft, NT, ND, bowel sounds + Extremities: no edema, no cyanosis    The results of significant diagnostics from this hospitalization (including imaging, microbiology, ancillary and laboratory) are listed below for reference.     Microbiology: Recent Results (from the past 240 hour(s))  Respiratory Panel by RT PCR (Flu A&B, Covid) - Nasopharyngeal Swab     Status: None   Collection Time: 03/04/20 12:24 AM   Specimen: Nasopharyngeal Swab  Result Value Ref Range Status   SARS Coronavirus 2 by RT PCR NEGATIVE NEGATIVE Final    Comment: (NOTE) SARS-CoV-2 target nucleic acids are NOT DETECTED.  The SARS-CoV-2 RNA is generally detectable in upper respiratoy specimens during the acute phase of infection. The lowest concentration of SARS-CoV-2 viral copies this assay can detect is 131 copies/mL. A negative result does not preclude SARS-Cov-2 infection and should not be used as the sole basis for treatment or other patient management decisions. A negative result may occur with  improper specimen collection/handling, submission of specimen other than nasopharyngeal swab, presence of viral mutation(s) within the areas targeted by this assay, and inadequate number of viral copies (<131 copies/mL). A negative result must be combined with clinical observations, patient history, and epidemiological information. The expected result is Negative.  Fact Sheet for Patients:  PinkCheek.be  Fact Sheet for Healthcare Providers:  GravelBags.it  This test is no t yet approved or cleared by the [place] FDA and  has been authorized for detection and/or diagnosis of SARS-CoV-2 by FDA under an Emergency Use Authorization (EUA). This EUA will remain  in effect  (meaning this test can be used) for the duration of the COVID-19 declaration under Section 564(b)(1) of the Act, 21 U.S.C. section 360bbb-3(b)(1), unless the authorization is terminated or revoked sooner.     Influenza A by PCR NEGATIVE NEGATIVE Final   Influenza B by PCR NEGATIVE NEGATIVE Final    Comment: (NOTE) The Xpert Xpress SARS-CoV-2/FLU/RSV assay is intended as an aid in  the diagnosis of influenza from Nasopharyngeal swab specimens and  should not be used as a sole basis for treatment. Nasal washings and  aspirates are unacceptable for Xpert Xpress SARS-CoV-2/FLU/RSV  testing.  Fact Sheet for Patients: PinkCheek.be  Fact Sheet for Healthcare Providers: GravelBags.it  This test is not yet approved or cleared by the [place] FDA and  has been authorized for detection and/or diagnosis of SARS-CoV-2 by  FDA under an Emergency Use Authorization (EUA). This EUA will remain  in effect (meaning this test can be used) for the duration of the  Covid-19 declaration under Section 564(b)(1) of the Act, 21  U.S.C. section 360bbb-3(b)(1), unless the authorization is  terminated or revoked. Performed at [facility], [place] [address], [place], [place] [address]      Labs:  BNP (last 3 results) No results for input(s): BNP in the last 8760 hours. Basic Metabolic Panel: Recent Labs  Lab 03/03/20 2013 03/04/20 0020 03/04/20 0414 03/04/20 0802 03/05/20 0225  NA 121* 130* 136  --  136  K 4.5 4.7 3.3*  --  4.2  CL 85*  --  101  --  104  CO2 24  --  27  --  25  GLUCOSE 922*  --  195*  --  229*  BUN 10  --  8  --  9  CREATININE 1.24  --  0.95  --  1.09  CALCIUM 9.6  --  9.0  --  8.5*  MG  --   --   --  2.1  --   PHOS  --   --   --  4.2  --    Liver Function Tests: No results for input(s): AST, ALT, ALKPHOS, BILITOT, PROT, ALBUMIN in the last 168 hours. No results for input(s): LIPASE, AMYLASE in the last 168  hours. No results for input(s): AMMONIA in the last 168 hours. CBC: Recent Labs  Lab 03/03/20 2013 03/04/20 0020  WBC 9.3  --   HGB 16.1 17.0  HCT 46.3 50.0  MCV 85.7  --   PLT 297  --    Cardiac Enzymes: No results for input(s): CKTOTAL, CKMB, CKMBINDEX, TROPONINI in the last 168 hours. BNP: Invalid input(s): POCBNP CBG: Recent Labs  Lab 03/04/20 1555 03/04/20 1827 03/04/20 2050 03/05/20 0758 03/05/20 1132  GLUCAP 325* 192* 226* 254* 362*   D-Dimer No results for input(s): DDIMER in the last 72 hours. Hgb A1c Recent Labs    03/04/20 0414  HGBA1C >15.5*   Lipid Profile No results for input(s): CHOL, HDL, LDLCALC, TRIG, CHOLHDL, LDLDIRECT in the last 72 hours. Thyroid function studies No results for input(s): TSH, T4TOTAL, T3FREE, THYROIDAB in the last 72 hours.  Invalid input(s): FREET3 Anemia work up No results for input(s): VITAMINB12, FOLATE, FERRITIN, TIBC, IRON, RETICCTPCT in the last 72 hours. Urinalysis    Component Value Date/Time   COLORURINE COLORLESS (A) 03/03/2020 1956   APPEARANCEUR CLEAR 03/03/2020 1956   LABSPEC 1.028 03/03/2020 1956   PHURINE 5.0 03/03/2020 1956   GLUCOSEU >=500 (A) 03/03/2020 1956   HGBUR NEGATIVE 03/03/2020 [place] NEGATIVE 03/03/2020 [place] NEGATIVE 03/03/2020 1956   PROTEINUR NEGATIVE 03/03/2020 1956   NITRITE NEGATIVE 03/03/2020 1956   LEUKOCYTESUR NEGATIVE 03/03/2020 1956   Sepsis Labs Invalid input(s): PROCALCITONIN,  WBC,  LACTICIDVEN Microbiology Recent Results (from the past 240 hour(s))  Respiratory Panel by RT PCR (Flu A&B, Covid) - Nasopharyngeal Swab     Status: None   Collection Time: 03/04/20 12:24 AM   Specimen: Nasopharyngeal Swab  Result Value Ref Range Status   SARS Coronavirus 2 by RT PCR NEGATIVE NEGATIVE Final    Comment: (NOTE) SARS-CoV-2 target nucleic acids are NOT DETECTED.  The SARS-CoV-2 RNA is generally detectable in upper respiratoy specimens during the acute  phase of infection. The lowest concentration of SARS-CoV-2 viral copies this assay can detect is 131 copies/mL. A negative result does not preclude SARS-Cov-2 infection and should not be used as the sole basis for treatment or other patient management decisions. A negative result may occur with  improper specimen collection/handling, submission of specimen other than nasopharyngeal swab, presence of viral mutation(s) within the areas targeted by this assay, and inadequate number of viral copies (<131 copies/mL). A negative result must be combined with clinical  observations, patient history, and epidemiological information. The expected result is Negative.  Fact Sheet for Patients:  PinkCheek.be  Fact Sheet for Healthcare Providers:  GravelBags.it  This test is no t yet approved or cleared by the [place] FDA and  has been authorized for detection and/or diagnosis of SARS-CoV-2 by FDA under an Emergency Use Authorization (EUA). This EUA will remain  in effect (meaning this test can be used) for the duration of the COVID-19 declaration under Section 564(b)(1) of the Act, 21 U.S.C. section 360bbb-3(b)(1), unless the authorization is terminated or revoked sooner.     Influenza A by PCR NEGATIVE NEGATIVE Final   Influenza B by PCR NEGATIVE NEGATIVE Final    Comment: (NOTE) The Xpert Xpress SARS-CoV-2/FLU/RSV assay is intended as an aid in  the diagnosis of influenza from Nasopharyngeal swab specimens and  should not be used as a sole basis for treatment. Nasal washings and  aspirates are unacceptable for Xpert Xpress SARS-CoV-2/FLU/RSV  testing.  Fact Sheet for Patients: PinkCheek.be  Fact Sheet for Healthcare Providers: GravelBags.it  This test is not yet approved or cleared by the [place] FDA and  has been authorized for detection and/or diagnosis of  SARS-CoV-2 by  FDA under an Emergency Use Authorization (EUA). This EUA will remain  in effect (meaning this test can be used) for the duration of the  Covid-19 declaration under Section 564(b)(1) of the Act, 21  U.S.C. section 360bbb-3(b)(1), unless the authorization is  terminated or revoked. Performed at [facility], [place] [address] [place], [place] [address]      Time coordinating discharge: 35 minutes  SIGNED:   Barb Merino, MD  [facility] 03/05/2020, 1:40 PM

## 2020-03-05 NOTE — TOC Transition Note (Signed)
Transition of Care [facility]) - CM/SW Discharge Note   Patient Details  Name: Rodney Foley MRN: [id number] Date of Birth: December 06, 1970  [facility] CM/SW Contact:  Erin Sons, LCSW Phone Number: 03/05/2020, 10:09 AM   Clinical Narrative:     CSW scheduled a hospital follow up appointment with Dr. Jillyn Hidden at 930am on 03/26/20. Pt informed of appointment.        Patient Goals and CMS Choice        Discharge Placement         Home with follow up PCP              Discharge Plan and Services                                     Social Determinants of Health (SDOH) Interventions     Readmission Risk Interventions No flowsheet data found.

## 2020-03-05 NOTE — Progress Notes (Signed)
Inpatient Diabetes Program Recommendations  AACE/ADA: New Consensus Statement on Inpatient Glycemic Control (2015)  Target Ranges:  Prepandial:   less than 140 mg/dL      Peak postprandial:   less than 180 mg/dL (1-2 hours)      Critically ill patients:  140 - 180 mg/dL   Lab Results  Component Value Date   GLUCAP 254 (H) 03/05/2020    Review of Glycemic Control Results for Rodney Foley, Rodney Foley (MRN [id number]) as of 03/05/2020 09:55  Ref. Range 03/04/2020 09:43 03/04/2020 15:55 03/04/2020 20:50 03/05/2020 07:58  Glucose-Capillary Latest Ref Range: 70 - 99 mg/dL 250 (H) 325 (H) 226 (H) 254 (H)   Diabetes history: New onset DM Outpatient Diabetes medications: none Current orders for Inpatient glycemic control: IV insulin to transition to Levemir 35 units QD, Novolog 10 units TID, Novolog 0-9 units TID, Novolog 0-5 units QHS  Inpatient Diabetes Program Recommendations:   Consider switching patient to Novolog 70/30. Consider Novolog 70/30 20 units x 1 this AM, considering patient already received AM correction and meal coverage. Then, continue with Novolog 70/30 30 units BID. Secure chat sent to MD.   Damaris Schooner with patient regarding new onset DM. Patient expresses recent fatigue, polyuria, and polydipsia.  Explained what a A1c is and what it measures. Current A1C still remains pending. Also reviewed goal A1c with patient, importance of good glucose control @ home, and blood sugar goals. Reviewed patho of DM, role of pancreas, need for insulin, differences between hypoglycemia vs hyperglycemia, differences between long acting insulin vs short acting insulin, interventions, 70/30, when to take, sick day rules, vascular changes and comorbidities. Patient will need a meter. Attached Relion information to discharge summary. Reviewed recommended frequency of CBG checks and when to call MD.  Patient admits to large consumption of sugary beverages, cookies, and cakes. Now that he knows abut his diagnosis  plans to switch lifestyle and make modifications. Reviewed plated method, carb counting, reading nutritional labels, and importance of being mindful. Dietitian still to see patient prior to DC. Educated patient on insulin pen use at home. Reviewed contents of insulin flexpen starter kit. Reviewed all steps if insulin pen including attachment of needle, 2-unit air shot, dialing up dose, giving injection, removing needle, disposal of sharps, storage of unused insulin, disposal of insulin etc. Patient able to provide successful return demonstration. Also reviewed troubleshooting with insulin pen. MD to give patient Rxs for insulin pens and insulin pen needles. Patient has no further questions, awaiting plan for follow up with PCP with TOC.  Thanks, Bronson Curb, MSN, RNC-OB Diabetes Coordinator [phone] (8a-5p)

## 2020-03-05 NOTE — Discharge Instructions (Signed)
Blood Glucose Monitoring, Adult Monitoring your blood sugar (glucose) is an important part of managing your diabetes (diabetes mellitus). Blood glucose monitoring involves checking your blood glucose as often as directed and keeping a record (log) of your results over time. Checking your blood glucose regularly and keeping a blood glucose log can:  Help you and your health care provider adjust your diabetes management plan as needed, including your medicines or insulin.  Help you understand how food, exercise, illnesses, and medicines affect your blood glucose.  Let you know what your blood glucose is at any time. You can quickly find out if you have low blood glucose (hypoglycemia) or high blood glucose (hyperglycemia). Your health care provider will set individualized treatment goals for you. Your goals will be based on your age, other medical conditions you have, and how you respond to diabetes treatment. Generally, the goal of treatment is to maintain the following blood glucose levels:  Before meals (preprandial): 80-130 mg/dL (4.4-7.2 mmol/L).  After meals (postprandial): below 180 mg/dL (10 mmol/L).  A1c level: less than 7%. Supplies needed:  Blood glucose meter.  Test strips for your meter. Each meter has its own strips. You must use the strips that came with your meter.  A needle to prick your finger (lancet). Do not use a lancet more than one time.  A device that holds the lancet (lancing device).  A journal or log book to write down your results. How to check your blood glucose  1. Wash your hands with soap and water. 2. Prick the side of your finger (not the tip) with the lancet. Use a different finger each time. 3. Gently rub the finger until a small drop of blood appears. 4. Follow instructions that come with your meter for inserting the test strip, applying blood to the strip, and using your blood glucose meter. 5. Write down your result and any notes. Some  meters allow you to use areas of your body other than your finger (alternative sites) to test your blood. The most common alternative sites are:  Forearm.  Thigh.  Palm of the hand. If you think you may have hypoglycemia, or if you have a history of not knowing when your blood glucose is getting low (hypoglycemia unawareness), do not use alternative sites. Use your finger instead. Alternative sites may not be as accurate as the fingers, because blood flow is slower in these areas. This means that the result you get may be delayed, and it may be different from the result that you would get from your finger. Follow these instructions at home: Blood glucose log   Every time you check your blood glucose, write down your result. Also write down any notes about things that may be affecting your blood glucose, such as your diet and exercise for the day. This information can help you and your health care provider: ? Look for patterns in your blood glucose over time. ? Adjust your diabetes management plan as needed.  Check if your meter allows you to download your records to a computer. Most glucose meters store a record of glucose readings in the meter. If you have type 1 diabetes:  Check your blood glucose 2 or more times a day.  Also check your blood glucose: ? Before every insulin injection. ? Before and after exercise. ? Before meals. ? 2 hours after a meal. ? Occasionally between 2:00 a.m. and 3:00 a.m., as directed. ? Before potentially dangerous tasks, like driving or using  heavy machinery. ? At bedtime.  You may need to check your blood glucose more often, up to 6-10 times a day, if you: ? Use an insulin pump. ? Need multiple daily injections (MDI). ? Have diabetes that is not well-controlled. ? Are ill. ? Have a history of severe hypoglycemia. ? Have hypoglycemia unawareness. If you have type 2 diabetes:  If you take insulin or other diabetes medicines, check your blood  glucose 2 or more times a day.  If you are on intensive insulin therapy, check your blood glucose 4 or more times a day. Occasionally, you may also need to check between 2:00 a.m. and 3:00 a.m., as directed.  Also check your blood glucose: ? Before and after exercise. ? Before potentially dangerous tasks, like driving or using heavy machinery.  You may need to check your blood glucose more often if: ? Your medicine is being adjusted. ? Your diabetes is not well-controlled. ? You are ill. General tips  Always keep your supplies with you.  If you have questions or need help, all blood glucose meters have a 24-hour "hotline" phone number that you can call. You may also contact your health care provider.  After you use a few boxes of test strips, adjust (calibrate) your blood glucose meter by following instructions that came with your meter. Contact a health care provider if:  Your blood glucose is at or above 240 mg/dL (13.3 mmol/L) for 2 days in a row.  You have been sick or have had a fever for 2 days or longer, and you are not getting better.  You have any of the following problems for more than 6 hours: ? You cannot eat or drink. ? You have nausea or vomiting. ? You have diarrhea. Get help right away if:  Your blood glucose is lower than 54 mg/dL (3 mmol/L).  You become confused or you have trouble thinking clearly.  You have difficulty breathing.  You have moderate or large ketone levels in your urine. Summary  Monitoring your blood sugar (glucose) is an important part of managing your diabetes (diabetes mellitus).  Blood glucose monitoring involves checking your blood glucose as often as directed and keeping a record (log) of your results over time.  Your health care provider will set individualized treatment goals for you. Your goals will be based on your age, other medical conditions you have, and how you respond to diabetes treatment.  Every time you check your  blood glucose, write down your result. Also write down any notes about things that may be affecting your blood glucose, such as your diet and exercise for the day. This information is not intended to replace advice given to you by your health care provider. Make sure you discuss any questions you have with your health care provider. Document Revised: 02/23/2018 Document Reviewed: 10/12/2015 Elsevier Patient Education  [place]. Blood Glucose Monitoring, Adult Monitoring your blood sugar (glucose) is an important part of managing your diabetes (diabetes mellitus). Blood glucose monitoring involves checking your blood glucose as often as directed and keeping a record (log) of your results over time. Checking your blood glucose regularly and keeping a blood glucose log can:  Help you and your health care provider adjust your diabetes management plan as needed, including your medicines or insulin.  Help you understand how food, exercise, illnesses, and medicines affect your blood glucose.  Let you know what your blood glucose is at any time. You can quickly find out if you  have low blood glucose (hypoglycemia) or high blood glucose (hyperglycemia). Your health care provider will set individualized treatment goals for you. Your goals will be based on your age, other medical conditions you have, and how you respond to diabetes treatment. Generally, the goal of treatment is to maintain the following blood glucose levels:  Before meals (preprandial): 80-130 mg/dL (4.4-7.2 mmol/L).  After meals (postprandial): below 180 mg/dL (10 mmol/L).  A1c level: less than 7%. Supplies needed:  Blood glucose meter.  Test strips for your meter. Each meter has its own strips. You must use the strips that came with your meter.  A needle to prick your finger (lancet). Do not use a lancet more than one time.  A device that holds the lancet (lancing device).  A journal or log book to write down your  results. How to check your blood glucose  6. Wash your hands with soap and water. 7. Prick the side of your finger (not the tip) with the lancet. Use a different finger each time. 8. Gently rub the finger until a small drop of blood appears. 9. Follow instructions that come with your meter for inserting the test strip, applying blood to the strip, and using your blood glucose meter. 10. Write down your result and any notes. Some meters allow you to use areas of your body other than your finger (alternative sites) to test your blood. The most common alternative sites are:  Forearm.  Thigh.  Palm of the hand. If you think you may have hypoglycemia, or if you have a history of not knowing when your blood glucose is getting low (hypoglycemia unawareness), do not use alternative sites. Use your finger instead. Alternative sites may not be as accurate as the fingers, because blood flow is slower in these areas. This means that the result you get may be delayed, and it may be different from the result that you would get from your finger. Follow these instructions at home: Blood glucose log   Every time you check your blood glucose, write down your result. Also write down any notes about things that may be affecting your blood glucose, such as your diet and exercise for the day. This information can help you and your health care provider: ? Look for patterns in your blood glucose over time. ? Adjust your diabetes management plan as needed.  Check if your meter allows you to download your records to a computer. Most glucose meters store a record of glucose readings in the meter. If you have type 1 diabetes:  Check your blood glucose 2 or more times a day.  Also check your blood glucose: ? Before every insulin injection. ? Before and after exercise. ? Before meals. ? 2 hours after a meal. ? Occasionally between 2:00 a.m. and 3:00 a.m., as directed. ? Before potentially dangerous tasks, like  driving or using heavy machinery. ? At bedtime.  You may need to check your blood glucose more often, up to 6-10 times a day, if you: ? Use an insulin pump. ? Need multiple daily injections (MDI). ? Have diabetes that is not well-controlled. ? Are ill. ? Have a history of severe hypoglycemia. ? Have hypoglycemia unawareness. If you have type 2 diabetes:  If you take insulin or other diabetes medicines, check your blood glucose 2 or more times a day.  If you are on intensive insulin therapy, check your blood glucose 4 or more times a day. Occasionally, you may also need to check between  2:00 a.m. and 3:00 a.m., as directed.  Also check your blood glucose: ? Before and after exercise. ? Before potentially dangerous tasks, like driving or using heavy machinery.  You may need to check your blood glucose more often if: ? Your medicine is being adjusted. ? Your diabetes is not well-controlled. ? You are ill. General tips  Always keep your supplies with you.  If you have questions or need help, all blood glucose meters have a 24-hour "hotline" phone number that you can call. You may also contact your health care provider.  After you use a few boxes of test strips, adjust (calibrate) your blood glucose meter by following instructions that came with your meter. Contact a health care provider if:  Your blood glucose is at or above 240 mg/dL (13.3 mmol/L) for 2 days in a row.  You have been sick or have had a fever for 2 days or longer, and you are not getting better.  You have any of the following problems for more than 6 hours: ? You cannot eat or drink. ? You have nausea or vomiting. ? You have diarrhea. Get help right away if:  Your blood glucose is lower than 54 mg/dL (3 mmol/L).  You become confused or you have trouble thinking clearly.  You have difficulty breathing.  You have moderate or large ketone levels in your urine. Summary  Monitoring your blood sugar (glucose)  is an important part of managing your diabetes (diabetes mellitus).  Blood glucose monitoring involves checking your blood glucose as often as directed and keeping a record (log) of your results over time.  Your health care provider will set individualized treatment goals for you. Your goals will be based on your age, other medical conditions you have, and how you respond to diabetes treatment.  Every time you check your blood glucose, write down your result. Also write down any notes about things that may be affecting your blood glucose, such as your diet and exercise for the day. This information is not intended to replace advice given to you by your health care provider. Make sure you discuss any questions you have with your health care provider. Document Revised: 02/23/2018 Document Reviewed: 10/12/2015 Elsevier Patient Education  [place]. Hyperglycemia Hyperglycemia occurs when the level of sugar (glucose) in the blood is too high. Glucose is a type of sugar that provides the body's main source of energy. Certain hormones (insulin and glucagon) control the level of glucose in the blood. Insulin lowers blood glucose, and glucagon increases blood glucose. Hyperglycemia can result from having too little insulin in the bloodstream, or from the body not responding normally to insulin. Hyperglycemia occurs most often in people who have diabetes (diabetes mellitus), but it can happen in people who do not have diabetes. It can develop quickly, and it can be life-threatening if it causes you to become severely dehydrated (diabetic ketoacidosis or hyperglycemic hyperosmolar state). Severe hyperglycemia is a medical emergency. What are the causes? If you have diabetes, hyperglycemia may be caused by:  Diabetes medicine.  Medicines that increase blood glucose or affect your diabetes control.  Not eating enough, or not eating often enough.  Changes in physical activity level.  Being sick  or having an infection. If you have prediabetes or undiagnosed diabetes:  Hyperglycemia may be caused by those conditions. If you do not have diabetes, hyperglycemia may be caused by:  Certain medicines, including steroid medicines, beta-blockers, epinephrine, and thiazide diuretics.  Stress.  Serious illness.  Surgery.  Diseases of the pancreas.  Infection. What increases the risk? Hyperglycemia is more likely to develop in people who have risk factors for diabetes, such as:  Having a family member with diabetes.  Having a gene for type 1 diabetes that is passed from parent to child (inherited).  Living in an area with cold weather conditions.  Exposure to certain viruses.  Certain conditions in which the body's disease-fighting (immune) system attacks itself (autoimmune disorders).  Being overweight or obese.  Having an inactive (sedentary) lifestyle.  Having been diagnosed with insulin resistance.  Having a history of prediabetes, gestational diabetes, or polycystic ovarian syndrome (PCOS).  Being of American-Indian, African-American, Hispanic/Latino, or Asian/Pacific Islander descent. What are the signs or symptoms? Hyperglycemia may not cause any symptoms. If you do have symptoms, they may include early warning signs, such as:  Increased thirst.  Hunger.  Feeling very tired.  Needing to urinate more often than usual.  Blurry vision. Other symptoms may develop if hyperglycemia gets worse, such as:  Dry mouth.  Loss of appetite.  Fruity-smelling breath.  Weakness.  Unexpected or rapid weight gain or weight loss.  Tingling or numbness in the hands or feet.  Headache.  Skin that does not quickly return to normal after being lightly pinched and released (poor skin turgor).  Abdominal pain.  Cuts or bruises that are slow to heal. How is this diagnosed? Hyperglycemia is diagnosed with a blood test to measure your blood glucose level. This blood  test is usually done while you are having symptoms. Your health care provider may also do a physical exam and review your medical history. You may have more tests to determine the cause of your hyperglycemia, such as:  A fasting blood glucose (FBG) test. You will not be allowed to eat (you will fast) for at least 8 hours before a blood sample is taken.  An A1c (hemoglobin A1c) blood test. This provides information about blood glucose control over the previous 2-3 months.  An oral glucose tolerance test (OGTT). This measures your blood glucose at two times: ? After fasting. This is your baseline blood glucose level. ? Two hours after drinking a beverage that contains glucose. How is this treated? Treatment depends on the cause of your hyperglycemia. Treatment may include:  Taking medicine to regulate your blood glucose levels. If you take insulin or other diabetes medicines, your medicine or dosage may be adjusted.  Lifestyle changes, such as exercising more, eating healthier foods, or losing weight.  Treating an illness or infection, if this caused your hyperglycemia.  Checking your blood glucose more often.  Stopping or reducing steroid medicines, if these caused your hyperglycemia. If your hyperglycemia becomes severe and it results in hyperglycemic hyperosmolar state, you must be hospitalized and given IV fluids. Follow these instructions at home:  General instructions  Take over-the-counter and prescription medicines only as told by your health care provider.  Do not use any products that contain nicotine or tobacco, such as cigarettes and e-cigarettes. If you need help quitting, ask your health care provider.  Limit alcohol intake to no more than 1 drink per day for nonpregnant women and 2 drinks per day for men. One drink equals 12 oz of beer, 5 oz of wine, or 1 oz of hard liquor.  Learn to manage stress. If you need help with this, ask your health care provider.  Keep all  follow-up visits as told by your health care provider. This is important. Eating and drinking  Maintain a healthy weight.  Exercise regularly, as directed by your health care provider.  Stay hydrated, especially when you exercise, get sick, or spend time in hot temperatures.  Eat healthy foods, such as: ? Lean proteins. ? Complex carbohydrates. ? Fresh fruits and vegetables. ? Low-fat dairy products. ? Healthy fats.  Drink enough fluid to keep your urine clear or pale yellow. If you have diabetes:  Make sure you know the symptoms of hyperglycemia.  Follow your diabetes management plan, as told by your health care provider. Make sure you: ? Take your insulin and medicines as directed. ? Follow your exercise plan. ? Follow your meal plan. Eat on time, and do not skip meals. ? Check your blood glucose as often as directed. Make sure to check your blood glucose before and after exercise. If you exercise longer or in a different way than usual, check your blood glucose more often. ? Follow your sick day plan whenever you cannot eat or drink normally. Make this plan in advance with your health care provider.  Share your diabetes management plan with people in your workplace, school, and household.  Check your urine for ketones when you are ill and as told by your health care provider.  Carry a medical alert card or wear medical alert jewelry. Contact a health care provider if:  Your blood glucose is at or above 240 mg/dL (13.3 mmol/L) for 2 days in a row.  You have problems keeping your blood glucose in your target range.  You have frequent episodes of hyperglycemia. Get help right away if:  You have difficulty breathing.  You have a change in how you think, feel, or act (mental status).  You have nausea or vomiting that does not go away. These symptoms may represent a serious problem that is an emergency. Do not wait to see if the symptoms will go away. Get medical help  right away. Call your local emergency services (911 in the [place] Do not drive yourself to the hospital. Summary  Hyperglycemia occurs when the level of sugar (glucose) in the blood is too high.  Hyperglycemia is diagnosed with a blood test to measure your blood glucose level. This blood test is usually done while you are having symptoms. Your health care provider may also do a physical exam and review your medical history.  If you have diabetes, follow your diabetes management plan as told by your health care provider.  Contact your health care provider if you have problems keeping your blood glucose in your target range. This information is not intended to replace advice given to you by your health care provider. Make sure you discuss any questions you have with your health care provider. Document Revised: 01/18/2016 Document Reviewed: 01/18/2016 Elsevier Patient Education  [place]. Hypoglycemia Hypoglycemia is when the sugar (glucose) level in your blood is too low. Signs of low blood sugar may include:  Feeling: ? Hungry. ? Worried or nervous (anxious). ? Sweaty and clammy. ? Confused. ? Dizzy. ? Sleepy. ? Sick to your stomach (nauseous).  Having: ? A fast heartbeat. ? A headache. ? A change in your vision. ? Tingling or no feeling (numbness) around your mouth, lips, or tongue. ? Jerky movements that you cannot control (seizure).  Having trouble with: ? Moving (coordination). ? Sleeping. ? Passing out (fainting). ? Getting upset easily (irritability). Low blood sugar can happen to people who have diabetes and people who do not have diabetes. Low blood sugar can happen quickly,  and it can be an emergency. Treating low blood sugar Low blood sugar is often treated by eating or drinking something sugary right away, such as:  Fruit juice, 4-6 oz (120-150 mL).  Regular soda (not diet soda), 4-6 oz (120-150 mL).  Low-fat milk, 4 oz (120 mL).  Several pieces of  hard candy.  Sugar or honey, 1 Tbsp (15 mL). Treating low blood sugar if you have diabetes If you can think clearly and swallow safely, follow the 15:15 rule:  Take 15 grams of a fast-acting carb (carbohydrate). Talk with your doctor about how much you should take.  Always keep a source of fast-acting carb with you, such as: ? Sugar tablets (glucose pills). Take 3-4 pills. ? 6-8 pieces of hard candy. ? 4-6 oz (120-150 mL) of fruit juice. ? 4-6 oz (120-150 mL) of regular (not diet) soda. ? 1 Tbsp (15 mL) honey or sugar.  Check your blood sugar 15 minutes after you take the carb.  If your blood sugar is still at or below 70 mg/dL (3.9 mmol/L), take 15 grams of a carb again.  If your blood sugar does not go above 70 mg/dL (3.9 mmol/L) after 3 tries, get help right away.  After your blood sugar goes back to normal, eat a meal or a snack within 1 hour.  Treating very low blood sugar If your blood sugar is at or below 54 mg/dL (3 mmol/L), you have very low blood sugar (severe hypoglycemia). This may also cause:  Passing out.  Jerky movements you cannot control (seizure).  Losing consciousness (coma). This is an emergency. Do not wait to see if the symptoms will go away. Get medical help right away. Call your local emergency services (911 in the [place] Do not drive yourself to the hospital. If you have very low blood sugar and you cannot eat or drink, you may need a glucagon shot (injection). A family member or friend should learn how to check your blood sugar and how to give you a glucagon shot. Ask your doctor if you need to have a glucagon shot kit at home. Follow these instructions at home: General instructions  Take over-the-counter and prescription medicines only as told by your doctor.  Stay aware of your blood sugar as told by your doctor.  Limit alcohol intake to no more than 1 drink a day for nonpregnant women and 2 drinks a day for men. One drink equals 12 oz of beer (355  mL), 5 oz of wine (148 mL), or 1 oz of hard liquor (44 mL).  Keep all follow-up visits as told by your doctor. This is important. If you have diabetes:   Follow your diabetes care plan as told by your doctor. Make sure you: ? Know the signs of low blood sugar. ? Take your medicines as told. ? Follow your exercise and meal plan. ? Eat on time. Do not skip meals. ? Check your blood sugar as often as told by your doctor. Always check it before and after exercise. ? Follow your sick day plan when you cannot eat or drink normally. Make this plan ahead of time with your doctor.  Share your diabetes care plan with: ? Your work or school. ? People you live with.  Check your pee (urine) for ketones: ? When you are sick. ? As told by your doctor.  Carry a card or wear jewelry that says you have diabetes. Contact a doctor if:  You have trouble keeping your blood sugar  in your target range.  You have low blood sugar often. Get help right away if:  You still have symptoms after you eat or drink something sugary.  Your blood sugar is at or below 54 mg/dL (3 mmol/L).  You have jerky movements that you cannot control.  You pass out. These symptoms may be an emergency. Do not wait to see if the symptoms will go away. Get medical help right away. Call your local emergency services (911 in the [place] Do not drive yourself to the hospital. Summary  Hypoglycemia happens when the level of sugar (glucose) in your blood is too low.  Low blood sugar can happen to people who have diabetes and people who do not have diabetes. Low blood sugar can happen quickly, and it can be an emergency.  Make sure you know the signs of low blood sugar and know how to treat it.  Always keep a source of sugar (fast-acting carb) with you to treat low blood sugar. This information is not intended to replace advice given to you by your health care provider. Make sure you discuss any questions you have with your  health care provider. Document Revised: 08/23/2018 Document Reviewed: 06/05/2015 Elsevier Patient Education  [place]. Hemoglobin A1c Test Why am I having this test? You may have the hemoglobin A1c test (HbA1c test) done to:  Evaluate your risk for developing diabetes (diabetes mellitus).  Diagnose diabetes.  Monitor long-term control of blood sugar (glucose) in people who have diabetes and help make treatment decisions. This test may be done with other blood glucose tests, such as fasting blood glucose and oral glucose tolerance tests. What is being tested? Hemoglobin is a type of protein in the blood that carries oxygen. Glucose attaches to hemoglobin to form glycated hemoglobin. This test checks the amount of glycated hemoglobin in your blood, which is a good indicator of the average amount of glucose in your blood during the past 2-3 months. What kind of sample is taken?  A blood sample is required for this test. It is usually collected by inserting a needle into a blood vessel. Tell a health care provider about:  All medicines you are taking, including vitamins, herbs, eye drops, creams, and over-the-counter medicines.  Any blood disorders you have.  Any surgeries you have had.  Any medical conditions you have.  Whether you are pregnant or may be pregnant. How are the results reported? Your results will be reported as a percentage that indicates how much of your hemoglobin has glucose attached to it (is glycated). Your health care provider will compare your results to normal ranges that were established after testing a large group of people (reference ranges). Reference ranges may vary among labs and hospitals. For this test, common reference ranges are:  Adult or child without diabetes: 4-5.6%.  Adult or child with diabetes and good blood glucose control: less than 7%. What do the results mean? If you have diabetes:  A result of less than 7% is considered normal,  meaning that your blood glucose is well controlled.  A result higher than 7% means that your blood glucose is not well controlled, and your treatment plan may need to be adjusted. If you do not have diabetes:  A result within the reference range is considered normal, meaning that you are not at high risk for diabetes.  A result of 5.7-6.4% means that you have a high risk of developing diabetes, and you may have prediabetes. Prediabetes is the condition  of having a blood glucose level that is higher than it should be, but not high enough for you to be diagnosed with diabetes. Having prediabetes puts you at risk for developing type 2 diabetes (type 2 diabetes mellitus). You may have more tests, including a repeat HbA1c test.  Results of 6.5% or higher on two separate HbA1c tests mean that you have diabetes. You may have more tests to confirm the diagnosis. Abnormally low HbA1c values may be caused by:  Pregnancy.  Severe blood loss.  Receiving donated blood (transfusions).  Low red blood cell count (anemia).  Long-term kidney failure.  Some unusual forms (variants) of hemoglobin. Talk with your health care provider about what your results mean. Questions to ask your health care provider Ask your health care provider, or the department that is doing the test:  When will my results be ready?  How will I get my results?  What are my treatment options?  What other tests do I need?  What are my next steps? Summary  The hemoglobin A1c test (HbA1c test) may be done to evaluate your risk for developing diabetes, to diagnose diabetes, and to monitor long-term control of blood sugar (glucose) in people who have diabetes and help make treatment decisions.  Hemoglobin is a type of protein in the blood that carries oxygen. Glucose attaches to hemoglobin to form glycated hemoglobin. This test checks the amount of glycated hemoglobin in your blood, which is a good indicator of the average  amount of glucose in your blood during the past 2-3 months.  Talk with your health care provider about what your results mean. This information is not intended to replace advice given to you by your health care provider. Make sure you discuss any questions you have with your health care provider. Document Revised: 04/14/2017 Document Reviewed: 12/13/2016 Elsevier Patient Education  [place].

## 2020-03-26 ENCOUNTER — Other Ambulatory Visit: Payer: Self-pay | Admitting: Family Medicine

## 2020-03-26 ENCOUNTER — Encounter: Payer: Self-pay | Admitting: Family Medicine

## 2020-03-26 ENCOUNTER — Other Ambulatory Visit: Payer: Self-pay

## 2020-03-26 ENCOUNTER — Ambulatory Visit: Payer: Self-pay | Attending: Family Medicine | Admitting: Family Medicine

## 2020-03-26 VITALS — BP 132/89 | HR 89 | Wt 260.2 lb

## 2020-03-26 DIAGNOSIS — Z09 Encounter for follow-up examination after completed treatment for conditions other than malignant neoplasm: Secondary | ICD-10-CM

## 2020-03-26 DIAGNOSIS — E119 Type 2 diabetes mellitus without complications: Secondary | ICD-10-CM

## 2020-03-26 DIAGNOSIS — B353 Tinea pedis: Secondary | ICD-10-CM

## 2020-03-26 LAB — GLUCOSE, POCT (MANUAL RESULT ENTRY): POC Glucose: 119 mg/dL — AB (ref 70–99)

## 2020-03-26 MED ORDER — TERBINAFINE HCL 1 % EX CREA: 1.0000 "application " | TOPICAL_CREAM | Freq: Two times a day (BID) | CUTANEOUS | 4 refills | Status: AC

## 2020-03-26 MED ORDER — INSULIN ASPART PROT & ASPART (70-30 MIX) 100 UNIT/ML PEN: 30.0000 [IU] | PEN_INJECTOR | Freq: Two times a day (BID) | SUBCUTANEOUS | 11 refills | Status: DC

## 2020-03-26 MED ORDER — METFORMIN HCL 500 MG PO TABS: 500.0000 mg | ORAL_TABLET | Freq: Two times a day (BID) | ORAL | 11 refills | Status: DC

## 2020-03-26 MED FILL — NOVOLOG MIX 70-30 FLEXPEN S: (70-30) 100 | 25 days supply | Qty: 15 | Fill #0

## 2020-03-26 MED FILL — METFORMIN HCL 500 MG TABS: 500 | 30 days supply | Qty: 60 | Fill #0

## 2020-03-26 NOTE — Progress Notes (Signed)
[facility] 

## 2020-03-26 NOTE — Patient Instructions (Signed)
Diabetes Basics  Diabetes (diabetes mellitus) is a long-term (chronic) disease. It occurs when the body does not properly use sugar (glucose) that is released from food after you eat. Diabetes may be caused by one or both of these problems:  Your pancreas does not make enough of a hormone called insulin.  Your body does not react in a normal way to insulin that it makes. Insulin lets sugars (glucose) go into cells in your body. This gives you energy. If you have diabetes, sugars cannot get into cells. This causes high blood sugar (hyperglycemia). Follow these instructions at home: How is diabetes treated? You may need to take insulin or other diabetes medicines daily to keep your blood sugar in balance. Take your diabetes medicines every day as told by your doctor. List your diabetes medicines here: Diabetes medicines  Name of medicine: ______________________________ ? Amount (dose): _______________ Time (a.m./p.m.): _______________ Notes: ___________________________________  Name of medicine: ______________________________ ? Amount (dose): _______________ Time (a.m./p.m.): _______________ Notes: ___________________________________  Name of medicine: ______________________________ ? Amount (dose): _______________ Time (a.m./p.m.): _______________ Notes: ___________________________________ If you use insulin, you will learn how to give yourself insulin by injection. You may need to adjust the amount based on the food that you eat. List the types of insulin you use here: Insulin  Insulin type: ______________________________ ? Amount (dose): _______________ Time (a.m./p.m.): _______________ Notes: ___________________________________  Insulin type: ______________________________ ? Amount (dose): _______________ Time (a.m./p.m.): _______________ Notes: ___________________________________  Insulin type: ______________________________ ? Amount (dose): _______________ Time (a.m./p.m.):  _______________ Notes: ___________________________________  Insulin type: ______________________________ ? Amount (dose): _______________ Time (a.m./p.m.): _______________ Notes: ___________________________________  Insulin type: ______________________________ ? Amount (dose): _______________ Time (a.m./p.m.): _______________ Notes: ___________________________________ How do I manage my blood sugar?  Check your blood sugar levels using a blood glucose monitor as directed by your doctor. Your doctor will set treatment goals for you. Generally, you should have these blood sugar levels:  Before meals (preprandial): 80-130 mg/dL (4.4-7.2 mmol/L).  After meals (postprandial): below 180 mg/dL (10 mmol/L).  A1c level: less than 7%. Write down the times that you will check your blood sugar levels: Blood sugar checks  Time: _______________ Notes: ___________________________________  Time: _______________ Notes: ___________________________________  Time: _______________ Notes: ___________________________________  Time: _______________ Notes: ___________________________________  Time: _______________ Notes: ___________________________________  Time: _______________ Notes: ___________________________________  What do I need to know about low blood sugar? Low blood sugar is called hypoglycemia. This is when blood sugar is at or below 70 mg/dL (3.9 mmol/L). Symptoms may include:  Feeling: ? Hungry. ? Worried or nervous (anxious). ? Sweaty and clammy. ? Confused. ? Dizzy. ? Sleepy. ? Sick to your stomach (nauseous).  Having: ? A fast heartbeat. ? A headache. ? A change in your vision. ? Tingling or no feeling (numbness) around the mouth, lips, or tongue. ? Jerky movements that you cannot control (seizure).  Having trouble with: ? Moving (coordination). ? Sleeping. ? Passing out (fainting). ? Getting upset easily (irritability). Treating low blood sugar To treat low blood  sugar, eat or drink something sugary right away. If you can think clearly and swallow safely, follow the 15:15 rule:  Take 15 grams of a fast-acting carb (carbohydrate). Talk with your doctor about how much you should take.  Some fast-acting carbs are: ? Sugar tablets (glucose pills). Take 3-4 glucose pills. ? 6-8 pieces of hard candy. ? 4-6 oz (120-150 mL) of fruit juice. ? 4-6 oz (120-150 mL) of regular (not diet) soda. ? 1 Tbsp (15 mL) honey or sugar.    Check your blood sugar 15 minutes after you take the carb.  If your blood sugar is still at or below 70 mg/dL (3.9 mmol/L), take 15 grams of a carb again.  If your blood sugar does not go above 70 mg/dL (3.9 mmol/L) after 3 tries, get help right away.  After your blood sugar goes back to normal, eat a meal or a snack within 1 hour. Treating very low blood sugar If your blood sugar is at or below 54 mg/dL (3 mmol/L), you have very low blood sugar (severe hypoglycemia). This is an emergency. Do not wait to see if the symptoms will go away. Get medical help right away. Call your local emergency services (911 in the [place] Do not drive yourself to the hospital. Questions to ask your health care provider  Do I need to meet with a diabetes educator?  What equipment will I need to care for myself at home?  What diabetes medicines do I need? When should I take them?  How often do I need to check my blood sugar?  What number can I call if I have questions?  When is my next doctor's visit?  Where can I find a support group for people with diabetes? Where to find more information  American Diabetes Association: www.diabetes.org  American Association of Diabetes Educators: www.diabeteseducator.org/patient-resources Contact a doctor if:  Your blood sugar is at or above 240 mg/dL (84.6 mmol/L) for 2 days in a row.  You have been sick or have had a fever for 2 days or more, and you are not getting better.  You have any of these  problems for more than 6 hours: ? You cannot eat or drink. ? You feel sick to your stomach (nauseous). ? You throw up (vomit). ? You have watery poop (diarrhea). Get help right away if:  Your blood sugar is lower than 54 mg/dL (3 mmol/L).  You get confused.  You have trouble: ? Thinking clearly. ? Breathing. Summary  Diabetes (diabetes mellitus) is a long-term (chronic) disease. It occurs when the body does not properly use sugar (glucose) that is released from food after digestion.  Take insulin and diabetes medicines as told.  Check your blood sugar every day, as often as told.  Keep all follow-up visits as told by your doctor. This is important. This information is not intended to replace advice given to you by your health care provider. Make sure you discuss any questions you have with your health care provider. Document Revised: 01/23/2019 Document Reviewed: 08/04/2017 Elsevier Patient Education  2020 Elsevier Inc.  Diabetes Mellitus and Standards of Medical Care Managing diabetes (diabetes mellitus) can be complicated. Your diabetes treatment may be managed by a team of health care providers, including:  A physician who specializes in diabetes (endocrinologist).  A nurse practitioner or physician assistant.  Nurses.  A diet and nutrition specialist (registered dietitian).  A certified diabetes educator (CDE).  An exercise specialist.  A pharmacist.  An eye doctor.  A foot specialist (podiatrist).  A dentist.  A primary care provider.  A mental health provider. Your health care providers follow guidelines to help you get the best quality of care. The following schedule is a general guideline for your diabetes management plan. Your health care providers may give you more specific instructions. Physical exams Upon being diagnosed with diabetes mellitus, and each year after that, your health care provider will ask about your medical and family history. He or  she will also do a  physical exam. Your exam may include:  Measuring your height, weight, and body mass index (BMI).  Checking your blood pressure. This will be done at every routine medical visit. Your target blood pressure may vary depending on your medical conditions, your age, and other factors.  Thyroid gland exam.  Skin exam.  Screening for damage to your nerves (peripheral neuropathy). This may include checking the pulse in your legs and feet and checking the level of sensation in your hands and feet.  A complete foot exam to inspect the structure and skin of your feet, including checking for cuts, bruises, redness, blisters, sores, or other problems.  Screening for blood vessel (vascular) problems, which may include checking the pulse in your legs and feet and checking your temperature. Blood tests Depending on your treatment plan and your personal needs, you may have the following tests done:  HbA1c (hemoglobin A1c). This test provides information about blood sugar (glucose) control over the previous 2-3 months. It is used to adjust your treatment plan, if needed. This test will be done: ? At least 2 times a year, if you are meeting your treatment goals. ? 4 times a year, if you are not meeting your treatment goals or if treatment goals have changed.  Lipid testing, including total, LDL, and HDL cholesterol and triglyceride levels. ? The goal for LDL is less than 100 mg/dL (5.5 mmol/L). If you are at high risk for complications, the goal is less than 70 mg/dL (3.9 mmol/L). ? The goal for HDL is 40 mg/dL (2.2 mmol/L) or higher for men and 50 mg/dL (2.8 mmol/L) or higher for women. An HDL cholesterol of 60 mg/dL (3.3 mmol/L) or higher gives some protection against heart disease. ? The goal for triglycerides is less than 150 mg/dL (8.3 mmol/L).  Liver function tests.  Kidney function tests.  Thyroid function tests. Dental and eye exams  Visit your dentist two times a  year.  If you have type 1 diabetes, your health care provider may recommend an eye exam 3-5 years after you are diagnosed, and then once a year after your first exam. ? For children with type 1 diabetes, a health care provider may recommend an eye exam when your child is age 50 or older and has had diabetes for 3-5 years. After the first exam, your child should get an eye exam once a year.  If you have type 2 diabetes, your health care provider may recommend an eye exam as soon as you are diagnosed, and then once a year after your first exam. Immunizations   The yearly flu (influenza) vaccine is recommended for everyone 6 months or older who has diabetes.  The pneumonia (pneumococcal) vaccine is recommended for everyone 2 years or older who has diabetes. If you are 46 or older, you may get the pneumonia vaccine as a series of two separate shots.  The hepatitis B vaccine is recommended for adults shortly after being diagnosed with diabetes.  Adults and children with diabetes should receive all other vaccines according to age-specific recommendations from the Centers for Disease Control and Prevention (CDC). Mental and emotional health Screening for symptoms of eating disorders, anxiety, and depression is recommended at the time of diagnosis and afterward as needed. If your screening shows that you have symptoms (positive screening result), you may need more evaluation and you may work with a mental health care provider. Treatment plan Your treatment plan will be reviewed at every medical visit. You and your health care provider  will discuss:  How you are taking your medicines, including insulin.  Any side effects you are experiencing.  Your blood glucose target goals.  The frequency of your blood glucose monitoring.  Lifestyle habits, such as activity level as well as tobacco, alcohol, and substance use. Diabetes self-management education Your health care provider will assess how well  you are monitoring your blood glucose levels and whether you are taking your insulin correctly. He or she may refer you to:  A certified diabetes educator to manage your diabetes throughout your life, starting at diagnosis.  A registered dietitian who can create or review your personal nutrition plan.  An exercise specialist who can discuss your activity level and exercise plan. Summary  Managing diabetes (diabetes mellitus) can be complicated. Your diabetes treatment may be managed by a team of health care providers.  Your health care providers follow guidelines in order to help you get the best quality of care.  Standards of care including having regular physical exams, blood tests, blood pressure monitoring, immunizations, screening tests, and education about how to manage your diabetes.  Your health care providers may also give you more specific instructions based on your individual health. This information is not intended to replace advice given to you by your health care provider. Make sure you discuss any questions you have with your health care provider. Document Revised: 01/19/2018 Document Reviewed: 01/29/2016 Elsevier Patient Education  2020 [employer] Inc.  

## 2020-03-26 NOTE — Progress Notes (Signed)
Patient ID: Rodney Foley, male    DOB: [birth year]  MRN: [id number]   SUBJECTIVE:  Rodney Foley is a 49 y.o. male who presents for hospital f/u.  Per patient prior to hospitalization he was having issues with frequent urination, constantly staying thirsty and having episodes of blurred vision.  Per hospital records, patient's blood glucose was 922 with normal anion gap at presentation.  Patient reports that the blurred vision, and increased thirst have decreased.  He does continue to have some increased urinary frequency believes believes that this is because he is drinking lots of water.  He reports that he has made changes in his diet to eliminate most sugars and decrease carbohydrate intake.  He reports home blood sugars are now in the low 100s to 120s fasting.  He is checking his blood sugars up to twice per day with a ReliOn meter.  He is compliant with his insulin, he takes 70/30 insulin twice daily as well as Metformin.  He denies any episodes of blood sugars being too low-no episodes of feeling shaky or jittery.  Overall he feels well.  He has had recurrent issues with athletes feet and requests cream if available. Where: [facility] When: 03/03/2020-03/05/2020 Primary Dx: New Diagnosis of type 2 diabetes   Patient Active Problem List   Diagnosis Date Noted  . Uncontrolled diabetes mellitus with hyperglycemia ([place]) 03/04/2020  . Hyperglycemia due to diabetes mellitus ([place]) 03/04/2020     Current Outpatient Medications on File Prior to Visit  Medication Sig Dispense Refill  . blood glucose meter kit and supplies Dispense based on patient and insurance preference. Use up to four times daily as directed. (FOR ICD-10 E10.9, E11.9). 1 each 0  . insulin aspart protamine - aspart (NOVOLOG 70/30 MIX) (70-30) 100 UNIT/ML FlexPen Inject 0.3 mLs (30 Units total) into the skin 2 (two) times daily with a meal. 15 mL 11  . INSULIN SYRINGE .5CC/29G 29G X 1/2" 0.5 ML MISC 1  Syringe by Does not apply route in the morning and at bedtime. 100 each 0  . metFORMIN (GLUCOPHAGE) 500 MG tablet Take 1 tablet (500 mg total) by mouth 2 (two) times daily with a meal. 60 tablet 11   No current facility-administered medications on file prior to visit.    No Known Allergies  Social History   Tobacco Use  . Smoking status: Current Every Day Smoker  . Smokeless tobacco: Never Used  . Tobacco comment: has been cutting back, plans to quit now (02/2020)  Vaping Use  . Vaping Use: Never used  Substance Use Topics  . Alcohol use: Yes    Comment: occasional, used to drink heavily (02/2020)   . Drug use: No     Family History  Problem Relation Age of Onset  . Diabetes Sister     Past Surgical History:  Procedure Laterality Date  . NO PAST SURGERIES      ROS: Review of Systems  Constitutional: Negative for chills, fatigue and fever.  HENT: Negative for sore throat and trouble swallowing.   Eyes: Negative for photophobia and visual disturbance.  Respiratory: Negative for cough and shortness of breath.   Cardiovascular: Negative for chest pain, palpitations and leg swelling.  Gastrointestinal: Negative for abdominal pain, blood in stool, constipation, diarrhea and nausea.  Endocrine: Positive for polyuria. Negative for polydipsia and polyphagia.  Genitourinary: Positive for frequency. Negative for dysuria.  Musculoskeletal: Negative for arthralgias and back pain.  Skin: Positive for rash (athletes feet).  Negative for wound.  Neurological: Negative for dizziness, numbness and headaches.  Hematological: Negative for adenopathy. Does not bruise/bleed easily.  Psychiatric/Behavioral: Negative for suicidal ideas. The patient is nervous/anxious.      PHYSICAL EXAM: BP 132/89 (BP Location: Right Arm, Patient Position: Sitting)   Pulse 89   Wt 260 lb 3.2 oz (118 kg)   SpO2 98%   BMI 37.33 kg/m    Physical Exam Vitals and nursing note reviewed.  Constitutional:       General: He is not in acute distress.    Appearance: Normal appearance.  Neck:     Vascular: No carotid bruit.  Cardiovascular:     Rate and Rhythm: Normal rate and regular rhythm.     Pulses:          Dorsalis pedis pulses are 1+ on the right side and 1+ on the left side.       Posterior tibial pulses are 1+ on the right side and 1+ on the left side.  Pulmonary:     Effort: Pulmonary effort is normal.     Breath sounds: Normal breath sounds.  Abdominal:     Palpations: Abdomen is soft.     Tenderness: There is no abdominal tenderness. There is no right CVA tenderness, left CVA tenderness, guarding or rebound.  Musculoskeletal:        General: No tenderness.     Cervical back: Normal range of motion and neck supple. No rigidity or tenderness.     Right lower leg: No edema.     Left lower leg: No edema.     Right foot: Normal range of motion. No deformity, bunion, Charcot foot or foot drop.     Left foot: Normal range of motion. No deformity, bunion, Charcot foot or foot drop.  Feet:     Right foot:     Protective Sensation: 10 sites tested. 10 sites sensed.     Skin integrity: Dry skin present. No skin breakdown or callus.     Toenail Condition: Right toenails are abnormally thick. Fungal disease present.    Left foot:     Protective Sensation: 10 sites tested. 10 sites sensed.     Skin integrity: Dry skin present. No skin breakdown or callus.     Toenail Condition: Left toenails are abnormally thick and long. Fungal disease present. Lymphadenopathy:     Cervical: No cervical adenopathy.  Skin:    General: Skin is warm and dry.     Findings: Rash (mild slightly hyperpigmented dry skin /rash on the medial margins of the feet/arches) present.     Comments: Onychomycosis of the great toenails with thickening and discoloration but no tenderness  Neurological:     General: No focal deficit present.     Mental Status: He is alert and oriented to person, place, and time.    Psychiatric:        Behavior: Behavior normal.        Thought Content: Thought content normal.        Judgment: Judgment normal.     Comments: Patient appeared anxious at times       ASSESSMENT AND PLAN: 1. New onset type 2 diabetes mellitus ([place]); 2. Hospital Discharge; Tinea pedis of both feet He reports that he feels much better and that his blood sugars are now well controlled on his current insulin and Metformin.  New prescriptions were sent to patient's pharmacy for medication.  He is also encouraged to apply over-the-counter financial discount  to help with current and future medical expenses.  He is to continue monitoring his blood sugars and he was made aware that glucometer and strips are available at this pharmacy at a reduced price if needed.  He is currently using a ReliOn meter and strips.  Patient's blood sugar at today's visit was 119.  Patient will have BMP done at today's visit.  Discussed diabetic foot care.  Patient was given information on diabetic standards of care as well as diabetes basics as part of his after visit summary.  Patient is aware that he will need referral at some point for yearly diabetic eye exam.  Diabetic foot care discussed this patient with onychomycosis of the great toenails bilaterally and patient with mild athlete's foot type rash.  Prescription sent to pharmacy for Lamisil for treatment of athlete's foot. - POCT glucose (manual entry) - Basic Metabolic Panel - insulin aspart protamine - aspart (NOVOLOG 70/30 MIX) (70-30) 100 UNIT/ML FlexPen; Inject 0.3 mLs (30 Units total) into the skin 2 (two) times daily with a meal.  Dispense: 15 mL; Refill: 11 - metFORMIN (GLUCOPHAGE) 500 MG tablet; Take 1 tablet (500 mg total) by mouth 2 (two) times daily with a meal.  Dispense: 60 tablet; Refill: 11   Return in about 3 months (around 06/26/2020) for DM; sooner if needed.   Antony Blackbird, MD, [facility]

## 2020-04-06 MED FILL — METFORMIN HCL 500 MG TABS: 500 | 30 days supply | Qty: 60 | Fill #0

## 2020-04-27 MED FILL — NOVOLOG MIX 70-30 FLEXPEN S: (70-30) 100 | 25 days supply | Qty: 15 | Fill #0

## 2020-06-26 ENCOUNTER — Ambulatory Visit: Payer: Self-pay | Admitting: Family Medicine

## 2020-06-26 ENCOUNTER — Encounter: Payer: Self-pay | Admitting: Internal Medicine

## 2020-06-26 ENCOUNTER — Other Ambulatory Visit: Payer: Self-pay | Admitting: Internal Medicine

## 2020-06-26 ENCOUNTER — Ambulatory Visit: Payer: MEDICAID | Attending: Internal Medicine | Admitting: Internal Medicine

## 2020-06-26 ENCOUNTER — Other Ambulatory Visit: Payer: Self-pay

## 2020-06-26 VITALS — BP 134/90 | HR 75 | Resp 16 | Ht 70.0 in | Wt 243.0 lb

## 2020-06-26 DIAGNOSIS — E119 Type 2 diabetes mellitus without complications: Secondary | ICD-10-CM

## 2020-06-26 DIAGNOSIS — F17200 Nicotine dependence, unspecified, uncomplicated: Secondary | ICD-10-CM

## 2020-06-26 DIAGNOSIS — E669 Obesity, unspecified: Secondary | ICD-10-CM

## 2020-06-26 DIAGNOSIS — Z5320 Procedure and treatment not carried out because of patient's decision for unspecified reasons: Secondary | ICD-10-CM

## 2020-06-26 DIAGNOSIS — R030 Elevated blood-pressure reading, without diagnosis of hypertension: Secondary | ICD-10-CM

## 2020-06-26 DIAGNOSIS — E1169 Type 2 diabetes mellitus with other specified complication: Secondary | ICD-10-CM

## 2020-06-26 DIAGNOSIS — Z2821 Immunization not carried out because of patient refusal: Secondary | ICD-10-CM

## 2020-06-26 LAB — GLUCOSE, POCT (MANUAL RESULT ENTRY): POC Glucose: 115 mg/dl — AB (ref 70–99)

## 2020-06-26 LAB — POCT GLYCOSYLATED HEMOGLOBIN (HGB A1C): HbA1c, POC (controlled diabetic range): 6.1 % (ref 0.0–7.0)

## 2020-06-26 MED ORDER — METFORMIN HCL 500 MG PO TABS: 500.0000 mg | ORAL_TABLET | Freq: Two times a day (BID) | ORAL | 2 refills | Status: DC

## 2020-06-26 MED ORDER — INSULIN ASPART PROT & ASPART (70-30 MIX) 100 UNIT/ML PEN: 30.0000 [IU] | PEN_INJECTOR | Freq: Two times a day (BID) | SUBCUTANEOUS | 11 refills | Status: DC

## 2020-06-26 NOTE — Patient Instructions (Signed)
Continue Metformin and your current dose of insulin.  Keep up the good work with healthy eating habits and regular exercise.  Try to get an eye exam done at your earliest convenience.  You can have the COVID-19 vaccine at [facility], [facility], [facility] or the health department.  Try to set a quit date to quit smoking.   https://www.mata.com/.pdf">  DASH Eating Plan DASH stands for Dietary Approaches to Stop Hypertension. The DASH eating plan is a healthy eating plan that has been shown to:  Reduce high blood pressure (hypertension).  Reduce your risk for type 2 diabetes, heart disease, and stroke.  Help with weight loss. What are tips for following this plan? Reading food labels  Check food labels for the amount of salt (sodium) per serving. Choose foods with less than 5 percent of the Daily Value of sodium. Generally, foods with less than 300 milligrams (mg) of sodium per serving fit into this eating plan.  To find whole grains, look for the word "whole" as the first word in the ingredient list. Shopping  Buy products labeled as "low-sodium" or "no salt added."  Buy fresh foods. Avoid canned foods and pre-made or frozen meals. Cooking  Avoid adding salt when cooking. Use salt-free seasonings or herbs instead of table salt or sea salt. Check with your health care provider or pharmacist before using salt substitutes.  Do not fry foods. Cook foods using healthy methods such as baking, boiling, grilling, roasting, and broiling instead.  Cook with heart-healthy oils, such as olive, canola, avocado, soybean, or sunflower oil. Meal planning  Eat a balanced diet that includes: ? 4 or more servings of fruits and 4 or more servings of vegetables each day. Try to fill one-half of your plate with fruits and vegetables. ? 6-8 servings of whole grains each day. ? Less than 6 oz (170 g) of lean meat, poultry, or fish each day. A 3-oz (85-g) serving of meat is  about the same size as a deck of cards. One egg equals 1 oz (28 g). ? 2-3 servings of low-fat dairy each day. One serving is 1 cup (237 mL). ? 1 serving of nuts, seeds, or beans 5 times each week. ? 2-3 servings of heart-healthy fats. Healthy fats called omega-3 fatty acids are found in foods such as walnuts, flaxseeds, fortified milks, and eggs. These fats are also found in cold-water fish, such as sardines, salmon, and mackerel.  Limit how much you eat of: ? Canned or prepackaged foods. ? Food that is high in trans fat, such as some fried foods. ? Food that is high in saturated fat, such as fatty meat. ? Desserts and other sweets, sugary drinks, and other foods with added sugar. ? Full-fat dairy products.  Do not salt foods before eating.  Do not eat more than 4 egg yolks a week.  Try to eat at least 2 vegetarian meals a week.  Eat more home-cooked food and less restaurant, buffet, and fast food.   Lifestyle  When eating at a restaurant, ask that your food be prepared with less salt or no salt, if possible.  If you drink alcohol: ? Limit how much you use to:  0-1 drink a day for women who are not pregnant.  0-2 drinks a day for men. ? Be aware of how much alcohol is in your drink. In the [place] one drink equals one 12 oz bottle of beer (355 mL), one 5 oz glass of wine (148 mL), or one 1 oz  glass of hard liquor (44 mL). General information  Avoid eating more than 2,300 mg of salt a day. If you have hypertension, you may need to reduce your sodium intake to 1,500 mg a day.  Work with your health care provider to maintain a healthy body weight or to lose weight. Ask what an ideal weight is for you.  Get at least 30 minutes of exercise that causes your heart to beat faster (aerobic exercise) most days of the week. Activities may include walking, swimming, or biking.  Work with your health care provider or dietitian to adjust your eating plan to your individual calorie needs. What  foods should I eat? Fruits All fresh, dried, or frozen fruit. Canned fruit in natural juice (without added sugar). Vegetables Fresh or frozen vegetables (raw, steamed, roasted, or grilled). Low-sodium or reduced-sodium tomato and vegetable juice. Low-sodium or reduced-sodium tomato sauce and tomato paste. Low-sodium or reduced-sodium canned vegetables. Grains Whole-grain or whole-wheat bread. Whole-grain or whole-wheat pasta. Brown rice. Orpah Cobb. Bulgur. Whole-grain and low-sodium cereals. Pita bread. Low-fat, low-sodium crackers. Whole-wheat flour tortillas. Meats and other proteins Skinless chicken or [place]. Ground chicken or [place]. Pork with fat trimmed off. Fish and seafood. Egg whites. Dried beans, peas, or lentils. Unsalted nuts, nut butters, and seeds. Unsalted canned beans. Lean cuts of beef with fat trimmed off. Low-sodium, lean precooked or cured meat, such as sausages or meat loaves. Dairy Low-fat (1%) or fat-free (skim) milk. Reduced-fat, low-fat, or fat-free cheeses. Nonfat, low-sodium ricotta or cottage cheese. Low-fat or nonfat yogurt. Low-fat, low-sodium cheese. Fats and oils Soft margarine without trans fats. Vegetable oil. Reduced-fat, low-fat, or light mayonnaise and salad dressings (reduced-sodium). Canola, safflower, olive, avocado, soybean, and sunflower oils. Avocado. Seasonings and condiments Herbs. Spices. Seasoning mixes without salt. Other foods Unsalted popcorn and pretzels. Fat-free sweets. The items listed above may not be a complete list of foods and beverages you can eat. Contact a dietitian for more information. What foods should I avoid? Fruits Canned fruit in a light or heavy syrup. Fried fruit. Fruit in cream or butter sauce. Vegetables Creamed or fried vegetables. Vegetables in a cheese sauce. Regular canned vegetables (not low-sodium or reduced-sodium). Regular canned tomato sauce and paste (not low-sodium or reduced-sodium). Regular tomato and  vegetable juice (not low-sodium or reduced-sodium). Rosita Fire. Olives. Grains Baked goods made with fat, such as croissants, muffins, or some breads. Dry pasta or rice meal packs. Meats and other proteins Fatty cuts of meat. Ribs. Fried meat. Tomasa Blase. Bologna, salami, and other precooked or cured meats, such as sausages or meat loaves. Fat from the back of a pig (fatback). Bratwurst. Salted nuts and seeds. Canned beans with added salt. Canned or smoked fish. Whole eggs or egg yolks. Chicken or [place] with skin. Dairy Whole or 2% milk, cream, and half-and-half. Whole or full-fat cream cheese. Whole-fat or sweetened yogurt. Full-fat cheese. Nondairy creamers. Whipped toppings. Processed cheese and cheese spreads. Fats and oils Butter. Stick margarine. Lard. Shortening. Ghee. Bacon fat. Tropical oils, such as coconut, palm kernel, or palm oil. Seasonings and condiments Onion salt, garlic salt, seasoned salt, table salt, and sea salt. Worcestershire sauce. Tartar sauce. Barbecue sauce. Teriyaki sauce. Soy sauce, including reduced-sodium. Steak sauce. Canned and packaged gravies. Fish sauce. Oyster sauce. Cocktail sauce. Store-bought horseradish. Ketchup. Mustard. Meat flavorings and tenderizers. Bouillon cubes. Hot sauces. Pre-made or packaged marinades. Pre-made or packaged taco seasonings. Relishes. Regular salad dressings. Other foods Salted popcorn and pretzels. The items listed above may not be a complete  list of foods and beverages you should avoid. Contact a dietitian for more information. Where to find more information  National Heart, Lung, and Blood Institute: PopSteam.is  American Heart Association: www.heart.org  Academy of Nutrition and Dietetics: www.eatright.org  National Kidney Foundation: www.kidney.org Summary  The DASH eating plan is a healthy eating plan that has been shown to reduce high blood pressure (hypertension). It may also reduce your risk for type 2 diabetes, heart  disease, and stroke.  When on the DASH eating plan, aim to eat more fresh fruits and vegetables, whole grains, lean proteins, low-fat dairy, and heart-healthy fats.  With the DASH eating plan, you should limit salt (sodium) intake to 2,300 mg a day. If you have hypertension, you may need to reduce your sodium intake to 1,500 mg a day.  Work with your health care provider or dietitian to adjust your eating plan to your individual calorie needs. This information is not intended to replace advice given to you by your health care provider. Make sure you discuss any questions you have with your health care provider. Document Revised: 04/05/2019 Document Reviewed: 04/05/2019 Elsevier Patient Education  2021 [employer].

## 2020-06-26 NOTE — Progress Notes (Signed)
Patient ID: Rodney Foley, male    DOB: [birth year]  MRN: [id number]  CC: Diabetes   Subjective: Rodney Foley is a 50 y.o. male who presents for chronic disease management.  Previous PCP was Dr. Chapman Fitch. Last seen 03/2020 with newly dx DM His concerns today include:  Pt with hx of tob dep, DM type 2 dx in 03/2020  DIABETES TYPE 2 Last A1C:   Results for orders placed or performed in visit on 06/26/20  POCT glucose (manual entry)  Result Value Ref Range   POC Glucose 115 (A) 70 - 99 mg/dl  POCT glycosylated hemoglobin (Hb A1C)  Result Value Ref Range   Hemoglobin A1C     HbA1c POC (<> result, manual entry)     HbA1c, POC (prediabetic range)     HbA1c, POC (controlled diabetic range) 6.1 0.0 - 7.0 %    Med Adherence:  [x]  Yes on Metformin and novolog 70/30 30 units BID Medication side effects:  []  Yes    [x]  No Home Monitoring?  [x]  Yes     Home glucose results range:before BF 97-104, before dinner 118-130.  Few times it was 150s. Diet Adherence: [x]  Yes  -eating more veggies and fruits rather than sweets.  Drinks mainly water, milk.  Stopped eating pork and switch from white to wheat bread.  Exercise: [x]  Yes -does jump rope QOD. Loss 17 lbs since 03/2020.  This is intentional wgh loss Hypoglycemic episodes?: [x]  Yes -a few times in the 70s.  Numbness of the feet? []  Yes    [x]  No Retinopathy hx? []  Yes    []  No Last eye exam: blurred vision when first dx with DM but not any more Comments: Blood pressure noted to be elevated today also on last visit.  Tob Dep:  Was at 1-1/2 pk a day. Almost quit.  Down to 1-2 a day.  Slowly weaning himself off  HM:  He has had not COVID vaccine. Declines having me scheduled through [facility].  States he will get one at the mall.  Declines flu and Pneumonia vaccines.  Declines colon CA screen  Patient Active Problem List   Diagnosis Date Noted  . Uncontrolled diabetes mellitus with hyperglycemia ([place]) 03/04/2020  . Hyperglycemia  due to diabetes mellitus ([place]) 03/04/2020     Current Outpatient Medications on File Prior to Visit  Medication Sig Dispense Refill  . blood glucose meter kit and supplies Dispense based on patient and insurance preference. Use up to four times daily as directed. (FOR ICD-10 E10.9, E11.9). 1 each 0  . terbinafine (LAMISIL AT) 1 % cream Apply 1 application topically 2 (two) times daily. X 10 days then as needed (Patient not taking: Reported on 06/26/2020) 30 g 4   No current facility-administered medications on file prior to visit.    No Known Allergies  Social History   Socioeconomic History  . Marital status: Single    Spouse name: Not on file  . Number of children: Not on file  . Years of education: Not on file  . Highest education level: Not on file  Occupational History  . Not on file  Tobacco Use  . Smoking status: Current Every Day Smoker  . Smokeless tobacco: Never Used  . Tobacco comment: has been cutting back, plans to quit now (02/2020)  Vaping Use  . Vaping Use: Never used  Substance and Sexual Activity  . Alcohol use: Yes    Comment: occasional, used to drink heavily (  02/2020)   . Drug use: No  . Sexual activity: Not on file  Other Topics Concern  . Not on file  Social History Narrative  . Not on file   Social Determinants of Health   Financial Resource Strain: Not on file  Food Insecurity: Not on file  Transportation Needs: Not on file  Physical Activity: Not on file  Stress: Not on file  Social Connections: Not on file  Intimate Partner Violence: Not on file    Family History  Problem Relation Age of Onset  . Diabetes Sister   . Diabetes Mother     Past Surgical History:  Procedure Laterality Date  . NO PAST SURGERIES      ROS: Review of Systems Negative except as stated above  PHYSICAL EXAM: BP 134/90   Pulse 75   Resp 16   Ht 5' 10"  (1.778 m)   Wt 243 lb (110.2 kg)   SpO2 96%   BMI 34.87 kg/m   Wt Readings from Last 3  Encounters:  06/26/20 243 lb (110.2 kg)  03/26/20 260 lb 3.2 oz (118 kg)  03/03/20 260 lb (117.9 kg)    Physical Exam General appearance - alert, well appearing, middle-aged African-American male and in no distress Mental status - normal mood, behavior, speech, dress, motor activity, and thought processes Neck - supple, no significant adenopathy Chest - clear to auscultation, no wheezes, rales or rhonchi, symmetric air entry Heart - normal rate, regular rhythm, normal S1, S2, no murmurs, rubs, clicks or gallops Extremities - peripheral pulses normal, no pedal edema, no clubbing or cyanosis Diabetic Foot Exam - Simple   Simple Foot Form Visual Inspection See comments: Yes Sensation Testing Intact to touch and monofilament testing bilaterally: Yes Pulse Check Posterior Tibialis and Dorsalis pulse intact bilaterally: Yes Comments Toenails are thickened and discolored.     CMP Latest Ref Rng & Units 03/05/2020 03/04/2020 03/04/2020  Glucose 70 - 99 mg/dL 229(H) 195(H) -  BUN 6 - 20 mg/dL 9 8 -  Creatinine 0.61 - 1.24 mg/dL 1.09 0.95 -  Sodium 135 - 145 mmol/L 136 136 130(L)  Potassium 3.5 - 5.1 mmol/L 4.2 3.3(L) 4.7  Chloride 98 - 111 mmol/L 104 101 -  CO2 22 - 32 mmol/L 25 27 -  Calcium 8.9 - 10.3 mg/dL 8.5(L) 9.0 -   Lipid Panel  No results found for: CHOL, TRIG, HDL, CHOLHDL, VLDL, LDLCALC, LDLDIRECT  CBC    Component Value Date/Time   WBC 9.3 03/03/2020 2013   RBC 5.40 03/03/2020 2013   HGB 17.0 03/04/2020 0020   HCT 50.0 03/04/2020 0020   PLT 297 03/03/2020 2013   MCV 85.7 03/03/2020 2013   [place] 29.8 03/03/2020 2013   MCHC 34.8 03/03/2020 2013   RDW 12.0 03/03/2020 2013    ASSESSMENT AND PLAN: 1. Type 2 diabetes mellitus with obesity ([place]) Commended him on getting his diabetes under control fairly quickly.  Encouraged him to continue healthy eating habits and regular exercise.  Continue current dose of Metformin and insulin.  I went over hypoglycemic symptoms  and how to treat.  Encouraged him to keep a pack of glucose tablets with him at all times. -Discussed the importance of yearly diabetic eye exams.  Encouraged him to get an eye exam as soon as possible.  Given the names of places where it is affordable to get a routine eye exam. - POCT glucose (manual entry) - POCT glycosylated hemoglobin (Hb A1C) - Microalbumin / creatinine urine ratio -  Lipid panel - Hepatic function panel - metFORMIN (GLUCOPHAGE) 500 MG tablet; Take 1 tablet (500 mg total) by mouth 2 (two) times daily with a meal.  Dispense: 180 tablet; Refill: 2 - insulin aspart protamine - aspart (NOVOLOG 70/30 MIX) (70-30) 100 UNIT/ML FlexPen; Inject 0.3 mLs (30 Units total) into the skin 2 (two) times daily with a meal.  Dispense: 15 mL; Refill: 11  2. Elevated blood pressure reading without diagnosis of hypertension DASH diet discussed and encouraged.  Follow-up with clinical pharmacist in 1 month for repeat blood pressure check.  If still not at goal, I would recommend initiating blood pressure medication in the form of ACE inhibitor or Norvasc.  3. Tobacco dependence Commended him on trying to quit and on how much he has cut back.  Discussed health risks associated with smoking.  He declines nicotine replacement therapy at this time.  Feels he is able to quit on his own.  Have encouraged him to set a quit date. Less than 5 mins spent on counseling  4. 23-polyvalent pneumococcal polysaccharide vaccine declined   5. Colon cancer screening declined Discussed colon cancer screening recommendations and methods to screen.  Patient declines at this time.    Patient was given the opportunity to ask questions.  Patient verbalized understanding of the plan and was able to repeat key elements of the plan.   Orders Placed This Encounter  Procedures  . Microalbumin / creatinine urine ratio  . Lipid panel  . Hepatic function panel  . POCT glucose (manual entry)  . POCT glycosylated  hemoglobin (Hb A1C)     Requested Prescriptions   Signed Prescriptions Disp Refills  . metFORMIN (GLUCOPHAGE) 500 MG tablet 180 tablet 2    Sig: Take 1 tablet (500 mg total) by mouth 2 (two) times daily with a meal.  . insulin aspart protamine - aspart (NOVOLOG 70/30 MIX) (70-30) 100 UNIT/ML FlexPen 15 mL 11    Sig: Inject 0.3 mLs (30 Units total) into the skin 2 (two) times daily with a meal.    Return in about 4 months (around 10/24/2020) for Give appt with [facility] in 4 wks for rrepeat BP check.  Karle Plumber, MD, FACP

## 2020-06-27 ENCOUNTER — Other Ambulatory Visit: Payer: Self-pay | Admitting: Internal Medicine

## 2020-06-27 LAB — LIPID PANEL
Chol/HDL Ratio: 4.1 ratio (ref 0.0–5.0)
Cholesterol, Total: 175 mg/dL (ref 100–199)
HDL: 43 mg/dL (ref >39)
LDL Chol Calc (NIH): 110 mg/dL — ABNORMAL HIGH (ref 0–99)
Triglycerides: 125 mg/dL (ref 0–149)
VLDL Cholesterol Cal: 22 mg/dL (ref 5–40)

## 2020-06-27 LAB — HEPATIC FUNCTION PANEL
ALT: 20 IU/L (ref 0–44)
AST: 28 IU/L (ref 0–40)
Albumin: 4.3 g/dL (ref 4.0–5.0)
Alkaline Phosphatase: 92 IU/L (ref 44–121)
Bilirubin Total: 0.3 mg/dL (ref 0.0–1.2)
Bilirubin, Direct: <0.1 mg/dL (ref 0.00–0.40)
Total Protein: 7.8 g/dL (ref 6.0–8.5)

## 2020-06-27 MED ORDER — ATORVASTATIN CALCIUM 10 MG PO TABS: 10.0000 mg | ORAL_TABLET | Freq: Every day | ORAL | 3 refills | Status: AC

## 2020-06-27 NOTE — Progress Notes (Signed)
Let patient know that his LDL cholesterol is 110 with goal being less than 70.  High cholesterol increases risk for heart attack and strokes.  I recommend starting a medication called atorvastatin to help lower cholesterol.  I have sent the prescription to his pharmacy.  Healthy eating habits and regular exercise will also help to lower cholesterol.  Liver function tests normal.

## 2020-06-30 ENCOUNTER — Telehealth: Payer: Self-pay

## 2020-06-30 NOTE — Telephone Encounter (Signed)
Contacted pt to go over lab results pt didn't answer lvm  

## 2020-07-24 ENCOUNTER — Ambulatory Visit: Payer: Self-pay | Admitting: Pharmacist

## 2020-08-28 ENCOUNTER — Other Ambulatory Visit: Payer: Self-pay | Admitting: Internal Medicine

## 2020-08-28 DIAGNOSIS — E669 Obesity, unspecified: Secondary | ICD-10-CM

## 2020-08-28 MED ORDER — METFORMIN HCL 500 MG PO TABS: ORAL_TABLET | Freq: Two times a day (BID) | ORAL | 0 refills | Status: AC

## 2020-08-28 MED ORDER — INSULIN ASPART PROT & ASPART (70-30 MIX) 100 UNIT/ML PEN: PEN_INJECTOR | SUBCUTANEOUS | 0 refills | Status: DC

## 2020-08-28 NOTE — Telephone Encounter (Signed)
Patient is completely out of his insulin aspart protamine - aspart (NOVOLOG 70/30 MIX) and metFORMIN (GLUCOPHAGE) 500 MG tablet.   [facility] is closed today therefore patient would like medication sent in today to    [facility] [address] [place] - [address] Phone:  [phone]  Fax:  [phone]      Patient would like a follow up call when completed.

## 2020-10-26 ENCOUNTER — Ambulatory Visit: Payer: Self-pay | Admitting: Internal Medicine

## 2020-12-08 ENCOUNTER — Ambulatory Visit: Payer: MEDICAID | Attending: Internal Medicine | Admitting: Internal Medicine

## 2020-12-08 ENCOUNTER — Encounter: Payer: Self-pay | Admitting: Internal Medicine

## 2020-12-08 ENCOUNTER — Other Ambulatory Visit: Payer: Self-pay

## 2020-12-08 VITALS — BP 126/87 | HR 82 | Resp 16 | Wt 237.0 lb

## 2020-12-08 DIAGNOSIS — K029 Dental caries, unspecified: Secondary | ICD-10-CM

## 2020-12-08 DIAGNOSIS — Z2821 Immunization not carried out because of patient refusal: Secondary | ICD-10-CM

## 2020-12-08 DIAGNOSIS — E669 Obesity, unspecified: Secondary | ICD-10-CM

## 2020-12-08 DIAGNOSIS — E1169 Type 2 diabetes mellitus with other specified complication: Secondary | ICD-10-CM

## 2020-12-08 DIAGNOSIS — F17200 Nicotine dependence, unspecified, uncomplicated: Secondary | ICD-10-CM

## 2020-12-08 DIAGNOSIS — I10 Essential (primary) hypertension: Secondary | ICD-10-CM

## 2020-12-08 LAB — POCT GLYCOSYLATED HEMOGLOBIN (HGB A1C): HbA1c, POC (controlled diabetic range): 6.1 % (ref 0.0–7.0)

## 2020-12-08 LAB — GLUCOSE, POCT (MANUAL RESULT ENTRY): POC Glucose: 136 mg/dl — AB (ref 70–99)

## 2020-12-08 MED ORDER — INSULIN ASPART PROT & ASPART (70-30 MIX) 100 UNIT/ML PEN: 27.0000 [IU] | PEN_INJECTOR | Freq: Two times a day (BID) | SUBCUTANEOUS | 4 refills | Status: AC

## 2020-12-08 NOTE — Patient Instructions (Signed)
Decrease insulin to 27 units twice a day.

## 2020-12-08 NOTE — Progress Notes (Signed)
Patient ID: Rodney Foley, male    DOB: 03/18/1971  MRN: [id number]  CC: Diabetes   Subjective: Rodney Foley is a 50 y.o. male who presents for chronic ds management.  Last seen in February of this year. His concerns today include:  Pt with hx of tob dep, DM type 2 dx in 03/2020   DIABETES TYPE 2 Last A1C:   Results for orders placed or performed in visit on 12/08/20  POCT glucose (manual entry)  Result Value Ref Range   POC Glucose 136 (A) 70 - 99 mg/dl  POCT glycosylated hemoglobin (Hb A1C)  Result Value Ref Range   Hemoglobin A1C     HbA1c POC (<> result, manual entry)     HbA1c, POC (prediabetic range)     HbA1c, POC (controlled diabetic range) 6.1 0.0 - 7.0 %    Med Adherence:  _0  Yes.  Patient is on metformin and NovoLog 70/30 30 units twice a day.  He is wanting to get off insulin. Medication side effects:  _1  Yes    _2  No Home Monitoring?  _3  Yes in a.m and at bedtime   _4  No Home glucose results range: a.m BS range 113-120, bedtime 120s Diet Adherence: _5  Yes    _6  No Exercise: _7  Yes  -on job. Unloads trucks Hypoglycemic episodes?: _8  Yes    _9  No Numbness of the feet? _10  Yes    _11  No Retinopathy hx? _12  Yes    _13  No Last eye exam: none.  Encouraged to get eye exam on last visit.  Declines referral at this time stating that his vision is perfect and he does not need an exam. Comments:   Elev BP: Noted to be elevated on last visit.  He tells me he has been limiting the salt in the food somewhat.  No chest pains or shortness of breath.  No swelling in the legs.    Tob dep: On last visit he indicated that he was slowly weaning himself off cigarettes.  At that time he was smoking 1 to 2 cigarettes a day.  He had plan to quit on his own.  Has not quit but has cut back even more.  When asked how much he is currently smoking, patient is very vague and really did not answer the question.    Wants dental referral for decayed tooth that is chipped  off.  HM: declines COVID vaccine, Declines pneumonia.   Patient Active Problem List   Diagnosis Date Noted   Tobacco dependence 06/26/2020   Uncontrolled diabetes mellitus with hyperglycemia ([place]) 03/04/2020   Hyperglycemia due to diabetes mellitus ([place]) 03/04/2020     Current Outpatient Medications on File Prior to Visit  Medication Sig Dispense Refill   atorvastatin (LIPITOR) 10 MG tablet Take 1 tablet (10 mg total) by mouth daily. 90 tablet 3   blood glucose meter kit and supplies Dispense based on patient and insurance preference. Use up to four times daily as directed. (FOR ICD-10 E10.9, E11.9). 1 each 0   insulin aspart protamine - aspart (NOVOLOG 70/30 MIX) (70-30) 100 UNIT/ML FlexPen INJECT 0.3 MLS (30 UNITS TOTAL) INTO THE SKIN 2 (TWO) TIMES DAILY WITH A MEAL. 15 mL 0   metFORMIN (GLUCOPHAGE) 500 MG tablet TAKE 1 TABLET (500 MG TOTAL) BY MOUTH 2 (TWO) TIMES DAILY WITH A MEAL. 180 tablet 0   terbinafine (LAMISIL AT) 1 % cream Apply 1 application topically 2 (two) times daily. X 10 days then as needed (  Patient not taking: Reported on 06/26/2020) 30 g 4   No current facility-administered medications on file prior to visit.    No Known Allergies  Social History   Socioeconomic History   Marital status: Single    Spouse name: Not on file   Number of children: Not on file   Years of education: Not on file   Highest education level: Not on file  Occupational History   Not on file  Tobacco Use   Smoking status: Every Day   Smokeless tobacco: Never   Tobacco comments:    has been cutting back, plans to quit now (02/2020)  Vaping Use   Vaping Use: Never used  Substance and Sexual Activity   Alcohol use: Yes    Comment: occasional, used to drink heavily (02/2020)    Drug use: No   Sexual activity: Not on file  Other Topics Concern   Not on file  Social History Narrative   Not on file   Social Determinants of Health   Financial Resource Strain: Not on file  Food  Insecurity: Not on file  Transportation Needs: Not on file  Physical Activity: Not on file  Stress: Not on file  Social Connections: Not on file  Intimate Partner Violence: Not on file    Family History  Problem Relation Age of Onset   Diabetes Sister    Diabetes Mother     Past Surgical History:  Procedure Laterality Date   NO PAST SURGERIES      ROS: Review of Systems Negative except as stated above  PHYSICAL EXAM: BP 126/87   Pulse 82   Resp 16   Wt 237 lb (107.5 kg)   SpO2 97%   BMI 34.01 kg/m   Physical Exam   General appearance - alert, well appearing, middle-aged African-American male and in no distress Mouth - mucous membranes moist, pharynx normal without lesions Chest - clear to auscultation, no wheezes, rales or rhonchi, symmetric air entry Heart - normal rate, regular rhythm, normal S1, S2, no murmurs, rubs, clicks or gallops Extremities - peripheral pulses normal, no pedal edema, no clubbing or cyanosis  CMP Latest Ref Rng & Units 06/26/2020 03/05/2020 03/04/2020  Glucose 70 - 99 mg/dL - 229(H) 195(H)  BUN 6 - 20 mg/dL - 9 8  Creatinine 0.61 - 1.24 mg/dL - 1.09 0.95  Sodium 135 - 145 mmol/L - 136 136  Potassium 3.5 - 5.1 mmol/L - 4.2 3.3(L)  Chloride 98 - 111 mmol/L - 104 101  CO2 22 - 32 mmol/L - 25 27  Calcium 8.9 - 10.3 mg/dL - 8.5(L) 9.0  Total Protein 6.0 - 8.5 g/dL 7.8 - -  Total Bilirubin 0.0 - 1.2 mg/dL 0.3 - -  Alkaline Phos 44 - 121 IU/L 92 - -  AST 0 - 40 IU/L 28 - -  ALT 0 - 44 IU/L 20 - -   Lipid Panel     Component Value Date/Time   CHOL 175 06/26/2020 1104   TRIG 125 06/26/2020 1104   HDL 43 06/26/2020 1104   CHOLHDL 4.1 06/26/2020 1104   LDLCALC 110 (H) 06/26/2020 1104    CBC    Component Value Date/Time   WBC 9.3 03/03/2020 2013   RBC 5.40 03/03/2020 2013   HGB 17.0 03/04/2020 0020   HCT 50.0 03/04/2020 0020   PLT 297 03/03/2020 2013   MCV 85.7 03/03/2020 2013   [place] 29.8 03/03/2020 2013   MCHC 34.8 03/03/2020 2013    RDW  12.0 03/03/2020 2013    ASSESSMENT AND PLAN: 1. Type 2 diabetes mellitus with obesity ([place]) Commended him on good control of his diabetes.  I advised against stopping metformin or insulin at this time.  We can try cutting back a little bit on the insulin as long as his blood sugars before meals remain below 140 and at bedtime remains below 180.  Advised him that he can try cutting back on the NovoLog to 27 units twice a day.  Continue metformin.  Continue healthy eating habits and regular exercise.  Strongly advised eye exam and explained to him why.  Patient still refuses. - POCT glucose (manual entry) - POCT glycosylated hemoglobin (Hb A1C)  2. Essential hypertension Blood pressure still elevated today and was elevated on last visit.  DASH diet encouraged.  I recommend starting low-dose of lisinopril.  Patient declined stating that he does not have a problem with his blood pressure.  3. Tobacco dependence Advised to quit.  Patient tells me he is still working on it.  Encouraged to set a quit date.  1 minutes spent on counseling.  4. Dental cavity - Ambulatory referral to Dentistry  5. COVID-19 vaccination declined Strongly advised that he gets the COVID-19 vaccines to help protect himself in his community.  Patient declined.  6. 23-polyvalent pneumococcal polysaccharide vaccine declined Recommended.  Patient declined.    Patient was given the opportunity to ask questions.  Patient verbalized understanding of the plan and was able to repeat key elements of the plan.   Orders Placed This Encounter  Procedures   Ambulatory referral to Dentistry   POCT glucose (manual entry)   POCT glycosylated hemoglobin (Hb A1C)     Requested Prescriptions    No prescriptions requested or ordered in this encounter    Return in about 4 months (around 04/10/2021).  Karle Plumber, MD, FACP

## 2020-12-09 ENCOUNTER — Telehealth: Payer: Self-pay

## 2020-12-09 LAB — MICROALBUMIN / CREATININE URINE RATIO
Creatinine, Urine: 103.9 mg/dL
Microalb/Creat Ratio: 6 mg/g creat (ref 0–29)
Microalbumin, Urine: 6.7 ug/mL

## 2020-12-09 NOTE — Telephone Encounter (Signed)
Contacted pt to go over lab results pt is aware and doesn't have any questions or concerns 

## 2024-05-04 ENCOUNTER — Other Ambulatory Visit: Payer: Self-pay

## 2024-05-04 ENCOUNTER — Encounter (HOSPITAL_COMMUNITY): Payer: Self-pay

## 2024-05-04 ENCOUNTER — Emergency Department (HOSPITAL_COMMUNITY)
Admission: EM | Admit: 2024-05-04 | Discharge: 2024-05-04 | Disposition: A | Discharge status: Home / Self Care | Attending: Emergency Medicine | Admitting: Emergency Medicine

## 2024-05-04 DIAGNOSIS — M545 Low back pain, unspecified: Secondary | ICD-10-CM | POA: Insufficient documentation

## 2024-05-04 DIAGNOSIS — M542 Cervicalgia: Secondary | ICD-10-CM | POA: Insufficient documentation

## 2024-05-04 DIAGNOSIS — Z794 Long term (current) use of insulin: Secondary | ICD-10-CM | POA: Diagnosis not present

## 2024-05-04 DIAGNOSIS — Y92481 Parking lot as the place of occurrence of the external cause: Secondary | ICD-10-CM | POA: Diagnosis not present

## 2024-05-04 MED ORDER — METHOCARBAMOL 500 MG PO TABS: 500.0000 mg | ORAL_TABLET | Freq: Two times a day (BID) | ORAL | 0 refills | Status: AC

## 2024-05-04 MED ORDER — KETOROLAC TROMETHAMINE 60 MG/2ML IM SOLN
60.0000 mg | Freq: Once | INTRAMUSCULAR | Status: AC
  Administered 2024-05-04: 60 mg via INTRAMUSCULAR
  Filled 2024-05-04: qty 2

## 2024-05-04 MED ORDER — OXYCODONE-ACETAMINOPHEN 5-325 MG PO TABS
1.0000 | ORAL_TABLET | ORAL | Status: DC | PRN
  Administered 2024-05-04: 1 via ORAL
  Filled 2024-05-04: qty 1
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
## 2024-05-05 ENCOUNTER — Telehealth: Payer: Self-pay

## 2024-05-05 NOTE — Telephone Encounter (Signed)
 Patient stated that the prescription did not get to the pharmacy. Called in to pharmacy for the patient.

## 2024-05-20 ENCOUNTER — Ambulatory Visit (HOSPITAL_COMMUNITY): Admission: EM | Admit: 2024-05-20 | Discharge: 2024-05-20 | Disposition: A | Discharge status: Home / Self Care

## 2024-05-20 ENCOUNTER — Encounter (HOSPITAL_COMMUNITY): Payer: Self-pay | Admitting: Emergency Medicine

## 2024-05-20 ENCOUNTER — Ambulatory Visit (INDEPENDENT_AMBULATORY_CARE_PROVIDER_SITE_OTHER)

## 2024-05-20 ENCOUNTER — Other Ambulatory Visit: Payer: Self-pay

## 2024-05-20 DIAGNOSIS — M545 Low back pain, unspecified: Secondary | ICD-10-CM | POA: Diagnosis not present

## 2024-05-20 DIAGNOSIS — M62838 Other muscle spasm: Secondary | ICD-10-CM | POA: Diagnosis not present

## 2024-05-20 DIAGNOSIS — I10 Essential (primary) hypertension: Secondary | ICD-10-CM

## 2024-05-20 MED ORDER — KETOROLAC TROMETHAMINE 30 MG/ML IJ SOLN
INTRAMUSCULAR | Status: AC
  Filled 2024-05-20: qty 1

## 2024-05-20 MED ORDER — MELOXICAM 15 MG PO TABS: 15.0000 mg | ORAL_TABLET | Freq: Every day | ORAL | 0 refills | Status: AC

## 2024-05-20 MED ORDER — KETOROLAC TROMETHAMINE 30 MG/ML IJ SOLN
30.0000 mg | Freq: Once | INTRAMUSCULAR | Status: AC
  Administered 2024-05-20: 30 mg via INTRAMUSCULAR

## 2024-05-20 NOTE — ED Notes (Signed)
 Administration of injection interrupted due to patient taking a phone call

## 2024-05-20 NOTE — Discharge Instructions (Addendum)
 You were given injection of Toradol  today for low back pain and neck pain New prescription of meloxicam  sent to your pharmacy.  Please take daily with food as needed for up to 1 month.  Can take Tylenol  with it for break through pain. Printed off handout showing low back exercises.  Recommend gentle stretching and range of motion. Do not recommend bedrest.  Recommend going back to work as soon as tolerated.  Will write out of work today. Follow-up as needed
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
